# Patient Record
Sex: Female | Born: 1985 | Race: White | Hispanic: No | Marital: Single | Smoking: Current every day smoker
Health system: Southern US, Community
[De-identification: ages and names within clinical notes are randomized; demographics above are authoritative.]

## PROBLEM LIST (undated history)

## (undated) DIAGNOSIS — F1120 Opioid dependence, uncomplicated: Secondary | ICD-10-CM

## (undated) DIAGNOSIS — Z9109 Other allergy status, other than to drugs and biological substances: Secondary | ICD-10-CM

## (undated) HISTORY — PX: TUBAL LIGATION: SHX77

## (undated) HISTORY — DX: Opioid dependence, uncomplicated: F11.20

## (undated) HISTORY — DX: Other allergy status, other than to drugs and biological substances: Z91.09

---
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
## 2004-05-09 ENCOUNTER — Emergency Department: Payer: Self-pay | Admitting: Emergency Medicine

## 2004-12-23 ENCOUNTER — Observation Stay: Payer: Self-pay | Admitting: Unknown Physician Specialty

## 2005-01-14 ENCOUNTER — Observation Stay: Payer: Self-pay

## 2005-01-15 ENCOUNTER — Observation Stay: Payer: Self-pay

## 2005-01-16 ENCOUNTER — Inpatient Hospital Stay: Payer: Self-pay | Admitting: Unknown Physician Specialty

## 2007-05-07 ENCOUNTER — Emergency Department: Payer: Self-pay | Admitting: Emergency Medicine

## 2007-05-28 ENCOUNTER — Emergency Department: Payer: Self-pay | Admitting: Emergency Medicine

## 2007-06-18 ENCOUNTER — Emergency Department: Payer: Self-pay | Admitting: Emergency Medicine

## 2008-07-16 ENCOUNTER — Emergency Department: Payer: Self-pay | Admitting: Emergency Medicine

## 2008-10-25 ENCOUNTER — Emergency Department: Payer: Self-pay | Admitting: Emergency Medicine

## 2009-01-03 ENCOUNTER — Emergency Department: Payer: Self-pay | Admitting: Emergency Medicine

## 2009-01-24 ENCOUNTER — Emergency Department: Payer: Self-pay | Admitting: Emergency Medicine

## 2009-05-21 ENCOUNTER — Emergency Department: Payer: Self-pay | Admitting: Emergency Medicine

## 2010-12-11 ENCOUNTER — Emergency Department: Payer: Self-pay | Admitting: Internal Medicine

## 2011-07-21 ENCOUNTER — Emergency Department: Payer: Self-pay | Admitting: Emergency Medicine

## 2011-07-21 LAB — COMPREHENSIVE METABOLIC PANEL
Albumin: 3.4 g/dL (ref 3.4–5.0)
Alkaline Phosphatase: 112 U/L (ref 50–136)
BUN: 12 mg/dL (ref 7–18)
Bilirubin,Total: 1.2 mg/dL — ABNORMAL HIGH (ref 0.2–1.0)
Co2: 25 mmol/L (ref 21–32)
Creatinine: 0.83 mg/dL (ref 0.60–1.30)
EGFR (Non-African Amer.): >60
Glucose: 96 mg/dL (ref 65–99)
Osmolality: 279 (ref 275–301)
Potassium: 3.6 mmol/L (ref 3.5–5.1)
SGPT (ALT): 32 U/L
Sodium: 140 mmol/L (ref 136–145)

## 2011-07-21 LAB — URINALYSIS, COMPLETE
Bilirubin,UR: NEGATIVE
Blood: NEGATIVE
Glucose,UR: NEGATIVE mg/dL (ref 0–75)
Ketone: NEGATIVE
Leukocyte Esterase: NEGATIVE
Nitrite: NEGATIVE
Ph: 5 (ref 4.5–8.0)
RBC,UR: 1 /HPF (ref 0–5)
Specific Gravity: 1.021 (ref 1.003–1.030)
Squamous Epithelial: 5

## 2011-07-21 LAB — CBC
HCT: 38.5 % (ref 35.0–47.0)
MCH: 28.5 pg (ref 26.0–34.0)
MCHC: 33.9 g/dL (ref 32.0–36.0)
RDW: 12.9 % (ref 11.5–14.5)
WBC: 8.6 10*3/uL (ref 3.6–11.0)

## 2011-07-21 LAB — RAPID INFLUENZA A&B ANTIGENS

## 2011-07-21 LAB — PREGNANCY, URINE: Pregnancy Test, Urine: NEGATIVE m[IU]/mL

## 2011-11-07 ENCOUNTER — Emergency Department: Payer: Self-pay | Admitting: Internal Medicine

## 2011-11-07 LAB — URINALYSIS, COMPLETE
Bacteria: NONE SEEN
Blood: NEGATIVE
Glucose,UR: NEGATIVE mg/dL (ref 0–75)
Ketone: NEGATIVE
Leukocyte Esterase: NEGATIVE
Nitrite: NEGATIVE
RBC,UR: NONE SEEN /HPF (ref 0–5)
Specific Gravity: 1.019 (ref 1.003–1.030)
Squamous Epithelial: 4
WBC UR: <1 /HPF (ref 0–5)

## 2011-11-07 LAB — HCG, QUANTITATIVE, PREGNANCY: Beta Hcg, Quant.: 90800 m[IU]/mL — ABNORMAL HIGH

## 2011-11-07 LAB — PREGNANCY, URINE: Pregnancy Test, Urine: POSITIVE m[IU]/mL

## 2012-04-20 ENCOUNTER — Observation Stay: Payer: Self-pay

## 2012-04-20 LAB — URINALYSIS, COMPLETE
Bilirubin,UR: NEGATIVE
Glucose,UR: 150 mg/dL (ref 0–75)
Leukocyte Esterase: NEGATIVE
Nitrite: POSITIVE
Ph: 5 (ref 4.5–8.0)
Protein: NEGATIVE
RBC,UR: <1 /HPF (ref 0–5)
Specific Gravity: 1.026 (ref 1.003–1.030)
Squamous Epithelial: 5
WBC UR: 3 /HPF (ref 0–5)

## 2012-06-26 ENCOUNTER — Inpatient Hospital Stay: Payer: Self-pay

## 2012-06-27 LAB — CBC WITH DIFFERENTIAL/PLATELET
Basophil #: 0.1 10*3/uL (ref 0.0–0.1)
Basophil %: 0.8 %
HCT: 36.2 % (ref 35.0–47.0)
HGB: 12.5 g/dL (ref 12.0–16.0)
Lymphocyte %: 26.9 %
MCH: 29.2 pg (ref 26.0–34.0)
MCHC: 34.6 g/dL (ref 32.0–36.0)
Monocyte #: 1 x10 3/mm — ABNORMAL HIGH (ref 0.2–0.9)
Neutrophil #: 8.2 10*3/uL — ABNORMAL HIGH (ref 1.4–6.5)
Neutrophil %: 63.8 %
Platelet: 159 10*3/uL (ref 150–440)
RBC: 4.28 10*6/uL (ref 3.80–5.20)

## 2012-08-27 ENCOUNTER — Ambulatory Visit: Payer: Self-pay

## 2012-08-27 LAB — HEMATOCRIT: HCT: 41.3 % (ref 35.0–47.0)

## 2012-08-27 LAB — HCG, QUANTITATIVE, PREGNANCY: Beta Hcg, Quant.: <1 m[IU]/mL — ABNORMAL LOW

## 2012-09-03 ENCOUNTER — Ambulatory Visit: Payer: Self-pay

## 2012-10-03 ENCOUNTER — Emergency Department: Payer: Self-pay | Admitting: Emergency Medicine

## 2012-10-03 LAB — URINALYSIS, COMPLETE
Bacteria: NONE SEEN
Bilirubin,UR: NEGATIVE
Blood: NEGATIVE
Ketone: NEGATIVE
Leukocyte Esterase: NEGATIVE
Nitrite: NEGATIVE
Ph: 6 (ref 4.5–8.0)
Specific Gravity: 1.05 (ref 1.003–1.030)
WBC UR: 1 /HPF (ref 0–5)

## 2012-10-03 LAB — CBC WITH DIFFERENTIAL/PLATELET
Basophil #: 0.3 10*3/uL — ABNORMAL HIGH (ref 0.0–0.1)
HCT: 40.3 % (ref 35.0–47.0)
Lymphocyte #: 3.4 10*3/uL (ref 1.0–3.6)
MCH: 28.2 pg (ref 26.0–34.0)
MCHC: 33.7 g/dL (ref 32.0–36.0)
MCV: 84 fL (ref 80–100)
Monocyte %: 5.6 %
Neutrophil #: 10.6 10*3/uL — ABNORMAL HIGH (ref 1.4–6.5)

## 2012-10-03 LAB — COMPREHENSIVE METABOLIC PANEL
Albumin: 3.6 g/dL (ref 3.4–5.0)
Anion Gap: 5 — ABNORMAL LOW (ref 7–16)
Bilirubin,Total: 0.5 mg/dL (ref 0.2–1.0)
Calcium, Total: 8.7 mg/dL (ref 8.5–10.1)
Chloride: 104 mmol/L (ref 98–107)
Creatinine: 0.75 mg/dL (ref 0.60–1.30)
EGFR (African American): >60
Glucose: 109 mg/dL — ABNORMAL HIGH (ref 65–99)
Potassium: 3.7 mmol/L (ref 3.5–5.1)
SGPT (ALT): 37 U/L (ref 12–78)
Sodium: 137 mmol/L (ref 136–145)
Total Protein: 7.8 g/dL (ref 6.4–8.2)

## 2012-10-18 LAB — CULTURE, BLOOD (SINGLE)

## 2012-12-19 IMAGING — CT CT ABD-PELV W/O CM
1 of 2 series · 15 of 32 positions shown, 19 images · non-contrast
Comparison: none

REASON FOR EXAM: (1) right flank pain RLQ pain  hx of stones; (2) RLQ
right flank pain hx of ston
COMMENTS:

PROCEDURE:     CT  - CT ABDOMEN AND PELVIS W[DATE]  [DATE]
RESULT:     Comparison: None
TECHNIQUE: Multiple axial images from the lung bases to the symphysis pubis
were obtained without oral and without intravenous contrast.

[Series 2: 3mm soft tissue · axial · 0.72mm/px · z∈[-959,-497]mm · 15 of 169 slices shown, 19 images]
[im 8/169  soft-tissue]
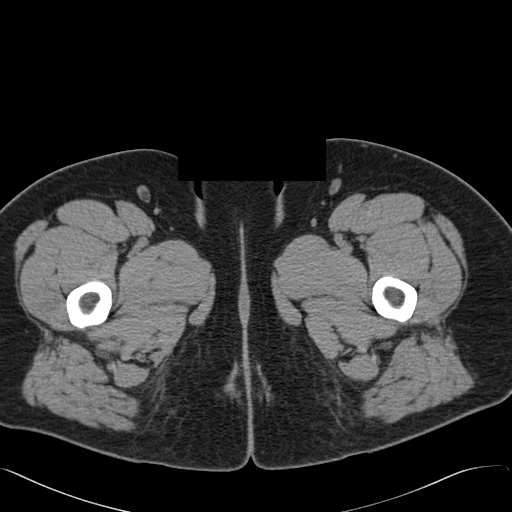
[im 8/169  bone]
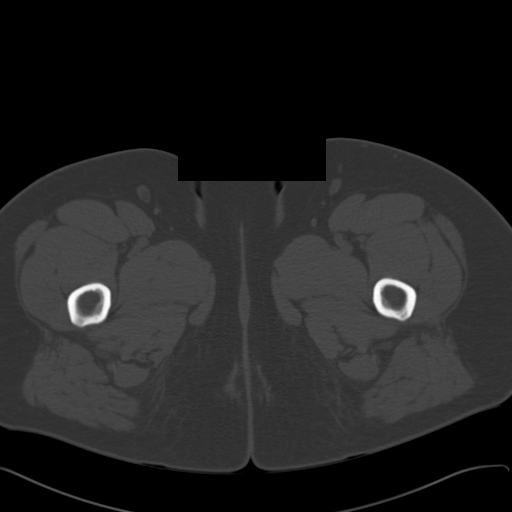
[im 22/169  soft-tissue]
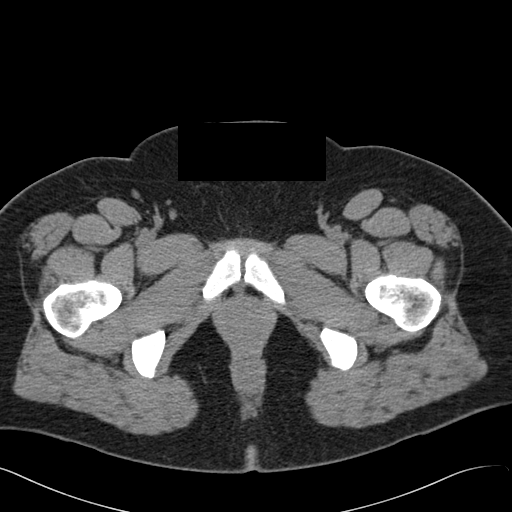
[im 36/169  soft-tissue]
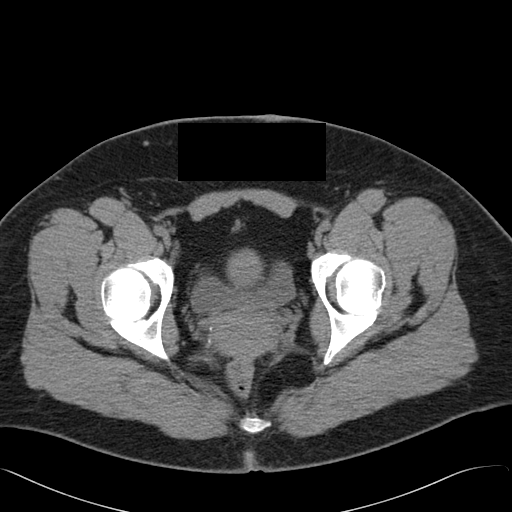
[im 50/169  soft-tissue]
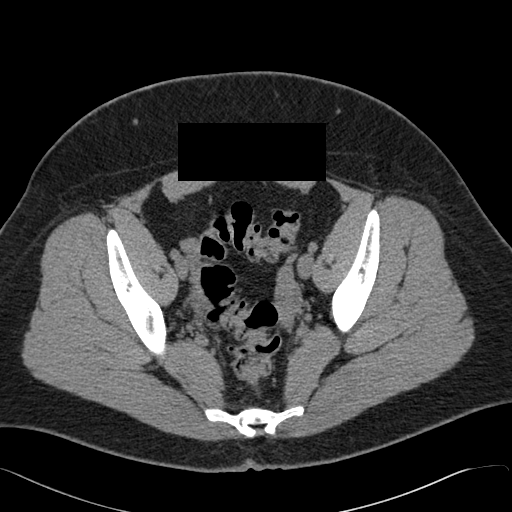
[im 57/169  soft-tissue]
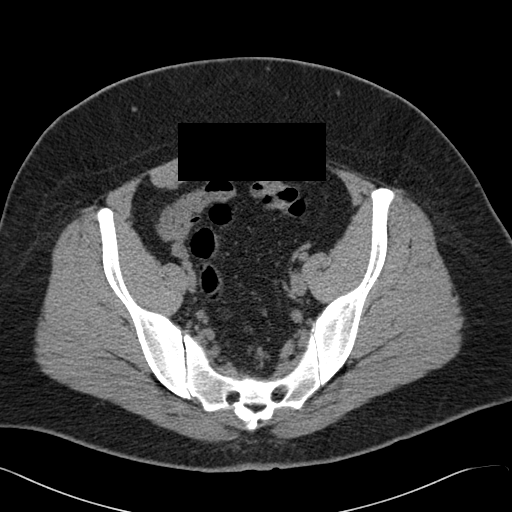
[im 71/169  soft-tissue]
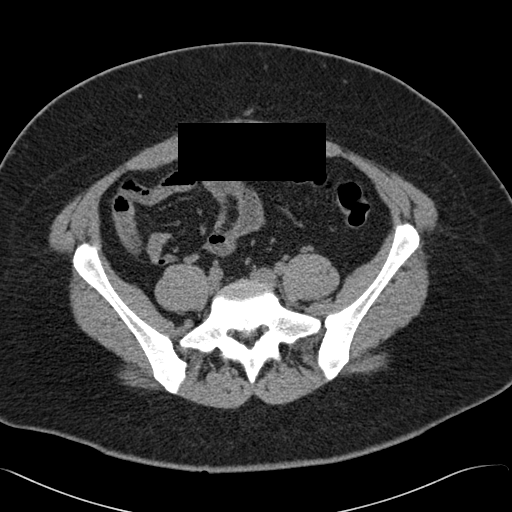
[im 85/169  soft-tissue]
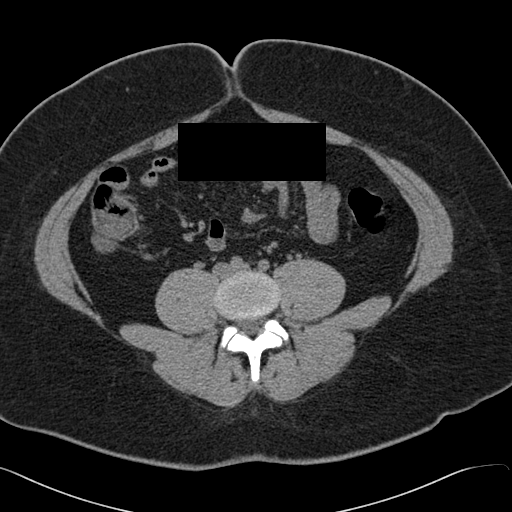
[im 99/169  soft-tissue]
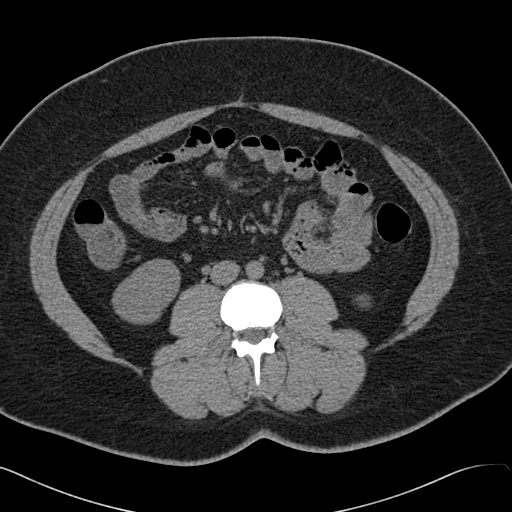
[im 113/169  soft-tissue]
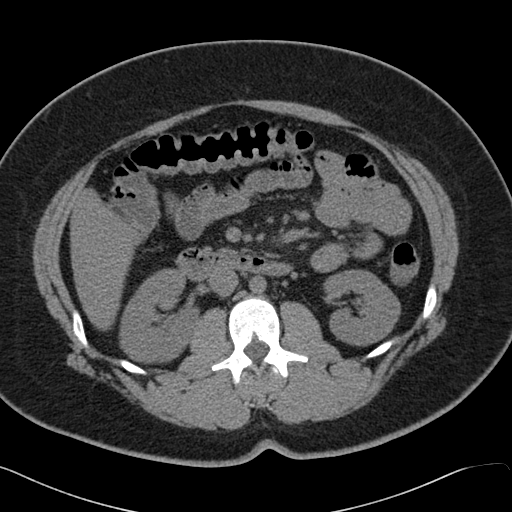
[im 113/169  bone]
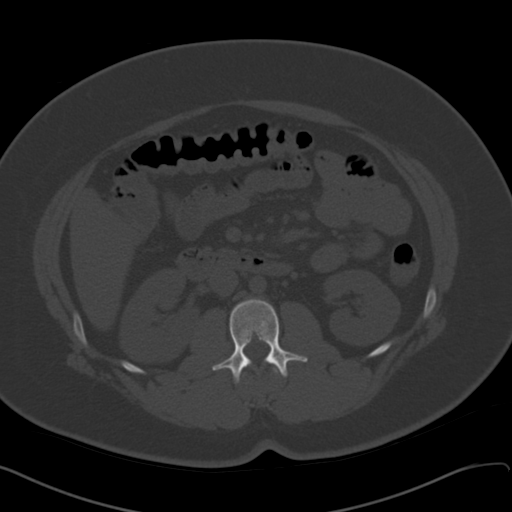
[im 120/169  soft-tissue]
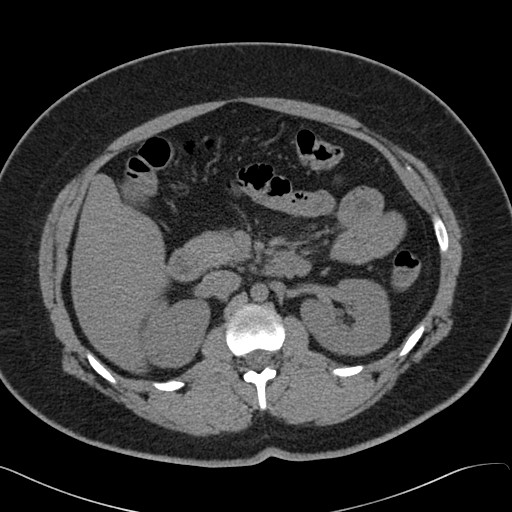
[im 134/169  soft-tissue]
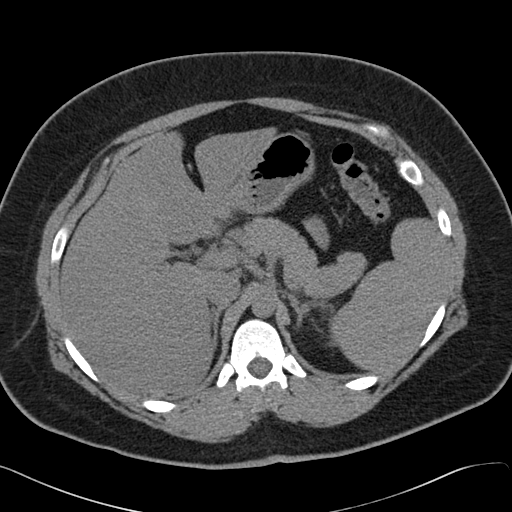
[im 141/169  lung]
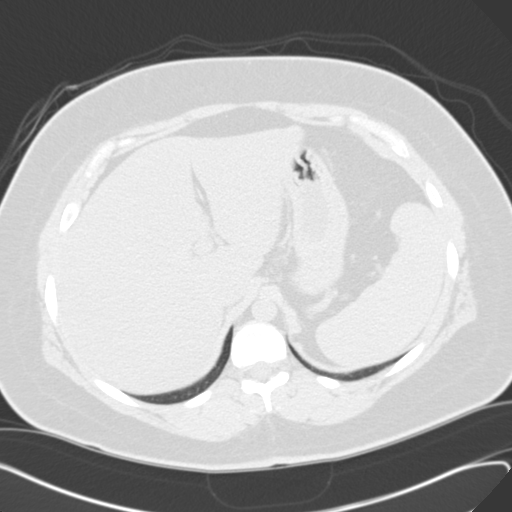
[im 148/169  soft-tissue]
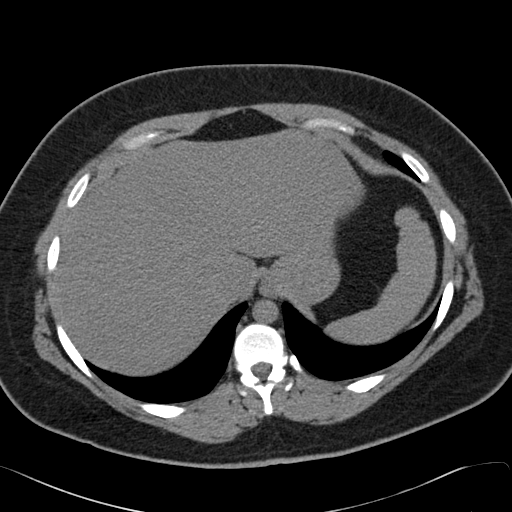
[im 148/169  lung]
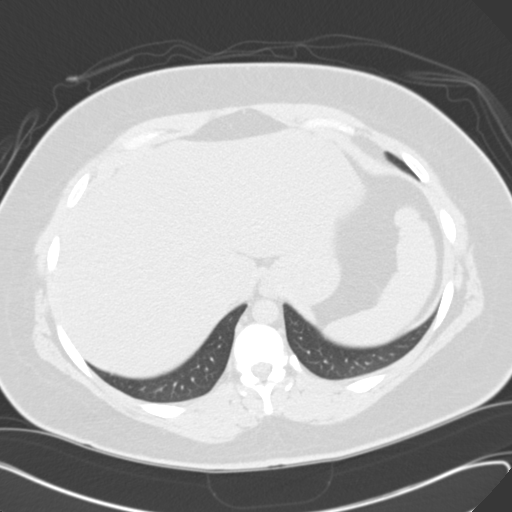
[im 155/169  lung]
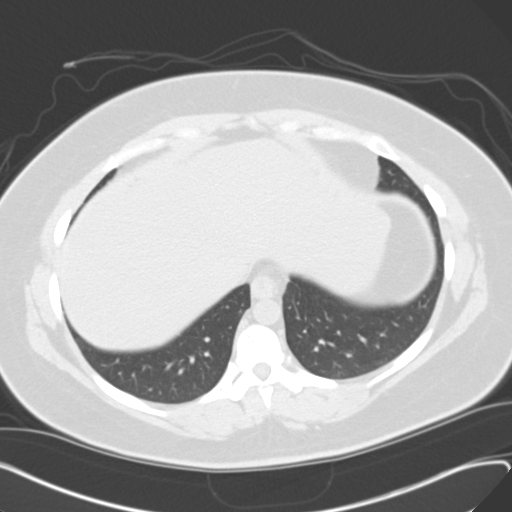
[im 162/169  soft-tissue]
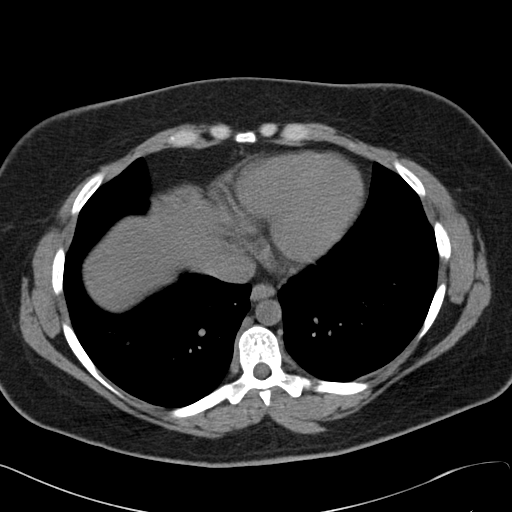
[im 162/169  lung]
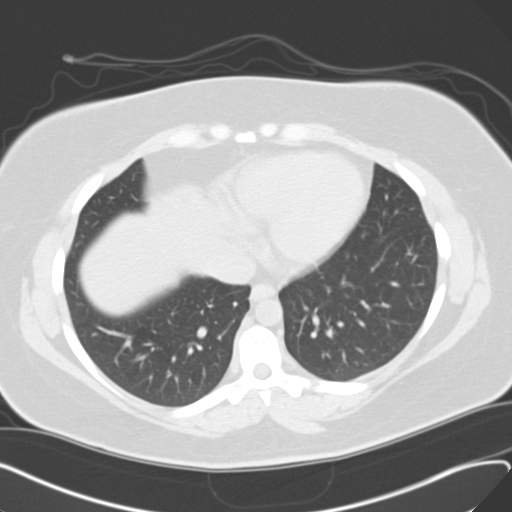

[15 of 32 positions shown; findings below may reference images not displayed]

FINDINGS: Lack of intravenous contrast limits evaluation of the solid abdominal
organs.  The liver is somewhat low in attenuation, raising the possibility
of hepatic steatosis. Tiny low-attenuation lesion in the left hepatic lobe
is too small to characterize. The gallbladder, spleen, adrenals, and
pancreas are unremarkable.

No renal calculi or hydronephrosis. No ureterectasis. There is a 2 mm
calcification in the right hemipelvis, which is unclear if it is within the
right ureter or immediately adjacent to the right ureter.

The small and large bowel are normal in caliber. The appendix is normal.

No aggressive lytic or sclerotic osseous lesions are identified.
IMPRESSION: Possible 2 mm calculus in the distal right ureter. No hydronephrosis or
ureterectasis. It is possible this calcification is a phlebolith immediately
adjacent to the distal right ureter.

## 2013-01-05 ENCOUNTER — Emergency Department: Payer: Self-pay | Admitting: Emergency Medicine

## 2013-05-26 ENCOUNTER — Emergency Department: Payer: Self-pay | Admitting: Internal Medicine

## 2013-06-30 ENCOUNTER — Emergency Department: Payer: Self-pay | Admitting: Emergency Medicine

## 2013-09-08 ENCOUNTER — Emergency Department: Payer: Self-pay | Admitting: Emergency Medicine

## 2013-09-13 ENCOUNTER — Emergency Department: Payer: Self-pay | Admitting: Emergency Medicine

## 2013-10-02 ENCOUNTER — Emergency Department: Payer: Self-pay | Admitting: Emergency Medicine

## 2013-11-02 ENCOUNTER — Emergency Department (HOSPITAL_COMMUNITY)
Admission: EM | Admit: 2013-11-02 | Discharge: 2013-11-02 | Discharge status: Against Medical Advice | Payer: Medicaid Other | Attending: Emergency Medicine | Admitting: Emergency Medicine

## 2013-11-02 ENCOUNTER — Encounter (HOSPITAL_COMMUNITY): Payer: Self-pay | Admitting: Emergency Medicine

## 2013-11-02 DIAGNOSIS — G8911 Acute pain due to trauma: Secondary | ICD-10-CM | POA: Insufficient documentation

## 2013-11-02 DIAGNOSIS — F17200 Nicotine dependence, unspecified, uncomplicated: Secondary | ICD-10-CM | POA: Insufficient documentation

## 2013-11-02 DIAGNOSIS — M25519 Pain in unspecified shoulder: Secondary | ICD-10-CM | POA: Insufficient documentation

## 2013-11-02 DIAGNOSIS — Z87828 Personal history of other (healed) physical injury and trauma: Secondary | ICD-10-CM | POA: Insufficient documentation

## 2013-11-02 DIAGNOSIS — M25512 Pain in left shoulder: Secondary | ICD-10-CM

## 2013-11-02 MED ORDER — HYDROCODONE-ACETAMINOPHEN 5-325 MG PO TABS: 2.0000 | ORAL_TABLET | Freq: Once | ORAL | Status: DC

## 2013-11-02 MED ORDER — KETOROLAC TROMETHAMINE 60 MG/2ML IM SOLN
60.0000 mg | Freq: Once | INTRAMUSCULAR | Status: DC
  Filled 2013-11-02: qty 2
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
## 2013-11-02 NOTE — ED Notes (Signed)
Patient stated she didn't want tylenol, ibuprofen or aleve because she has it at home.   She didn't want toradol, because she doesn't like shots.   Patient had two young children with her and the baby was nonstop crying.   RNs tried to console the child.   Patient's husband was to come back in "30 minutes" to get the children.   Patient was advised that the children could not go to xray with patient and that we would wait until husband got back and would take her then as to not further upset the children.   Patient was upset and crying, stated "my husband's 30 minutes is sometimes 2 hours".   I will just go.   Patient was calmed down and she agreed to wait, then left with the children when I stepped out of the room. PA made aware.
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
## 2013-11-04 NOTE — ED Provider Notes (Signed)
Medical screening examination/treatment/procedure(s) were performed by non-physician practitioner and as supervising physician I was immediately available for consultation/collaboration.  Shaindel Sweeten Y. Riannon Mukherjee, MD 11/04/13 0728 

## 2014-03-04 IMAGING — CT CT ABD-PELV W/ CM
1 of 2 series · 15 of 32 positions shown, 19 images · non-contrast
Comparison: none

REASON FOR EXAM: (1) abd red swollen tender pus coming from abd; (2) abd
pain swelling redness.
COMMENTS:

PROCEDURE:     CT  - CT ABDOMEN / PELVIS  W  - October 03, 2012  [DATE]
RESULT:     Comparison is made to a prior study dated 07/21/2011.
TECHNIQUE: Helical 3 mm sections were obtained from the lung bases through
the pubic symphysis status post intravenous administration of 100 mL of
Fsovue-DOO.

[Series 2: 3mm soft tissue · axial · 0.88mm/px · z∈[-498,-60]mm · 15 of 160 slices shown, 19 images]
[im 7/160  soft-tissue]
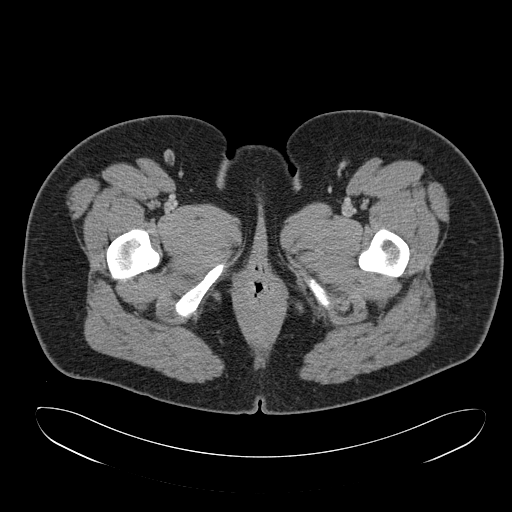
[im 7/160  bone]
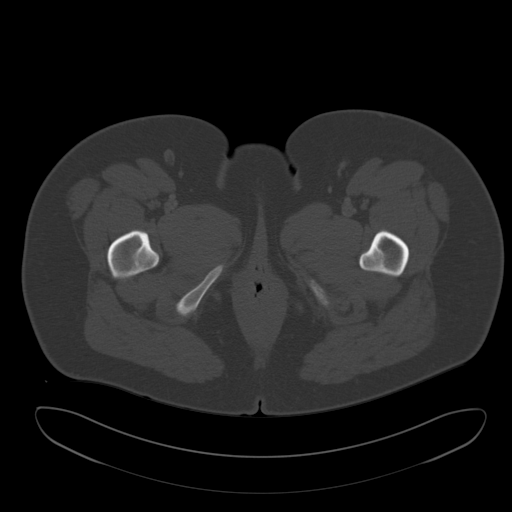
[im 20/160  soft-tissue]
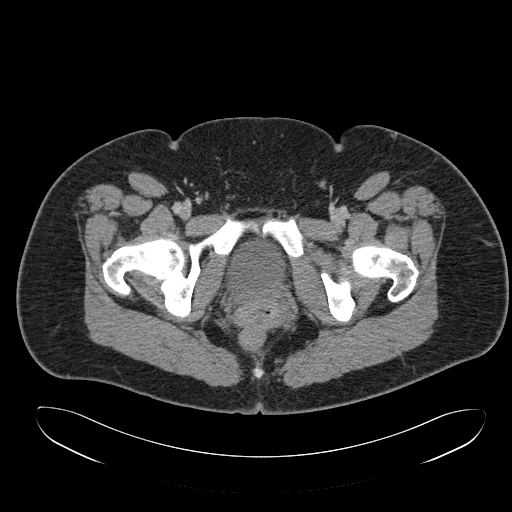
[im 34/160  soft-tissue]
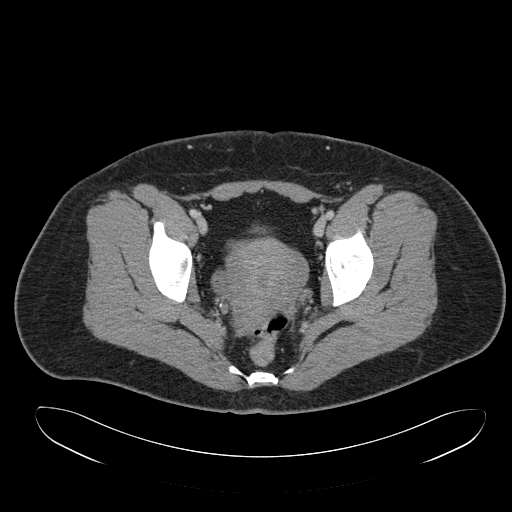
[im 47/160  soft-tissue]
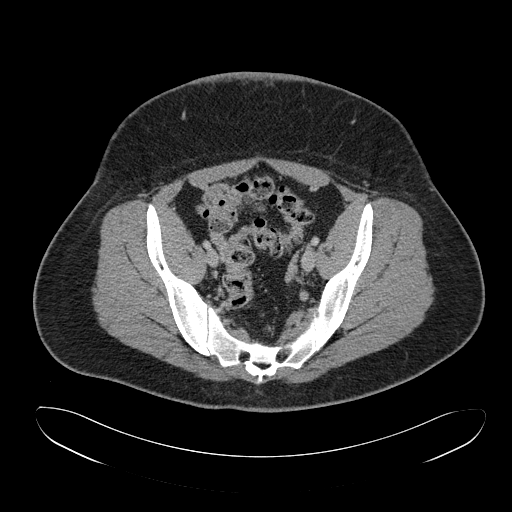
[im 54/160  soft-tissue]
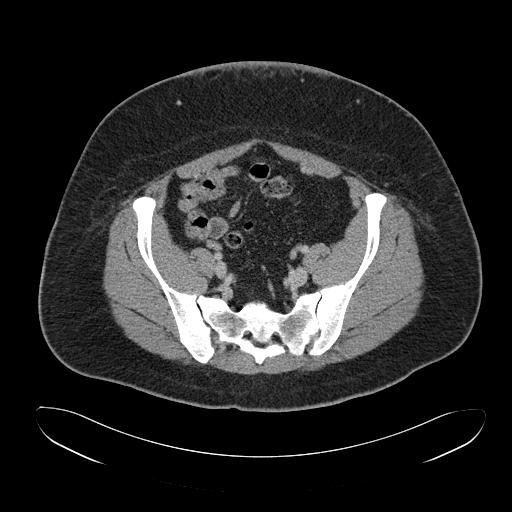
[im 67/160  soft-tissue]
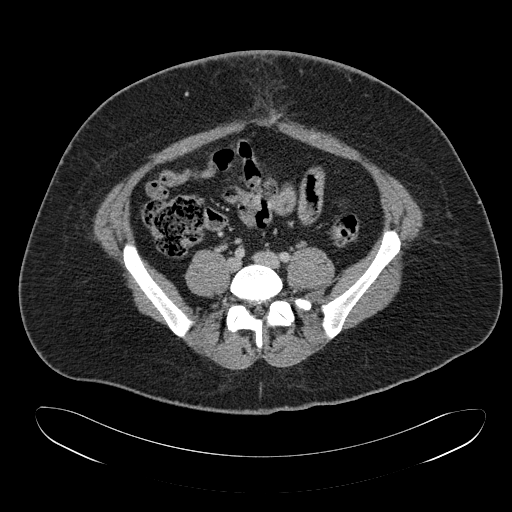
[im 80/160  soft-tissue]
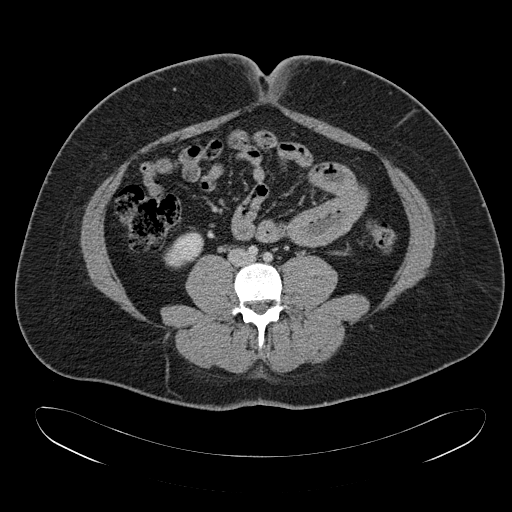
[im 93/160  soft-tissue]
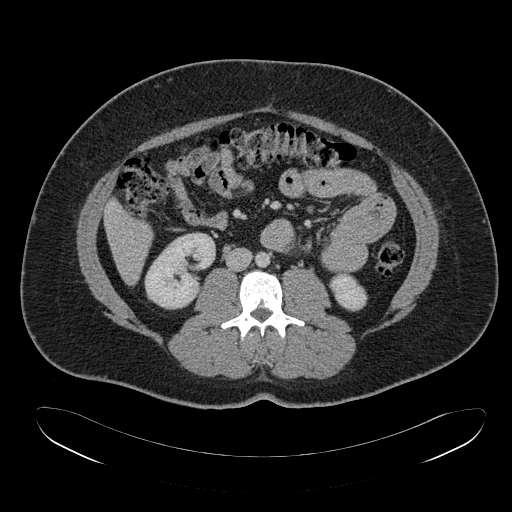
[im 107/160  soft-tissue]
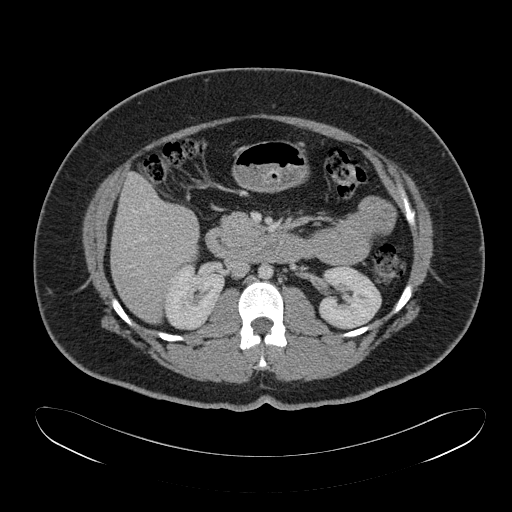
[im 107/160  bone]
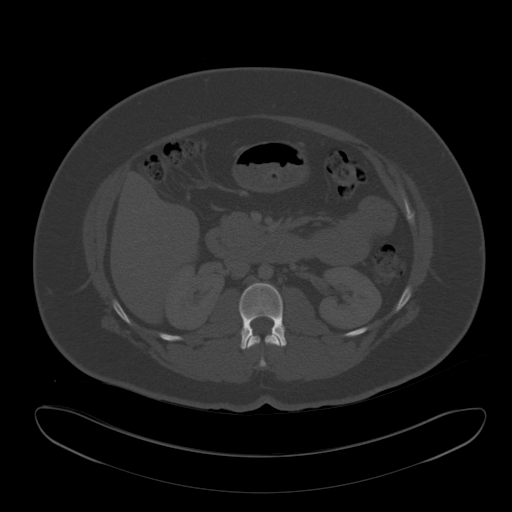
[im 113/160  soft-tissue]
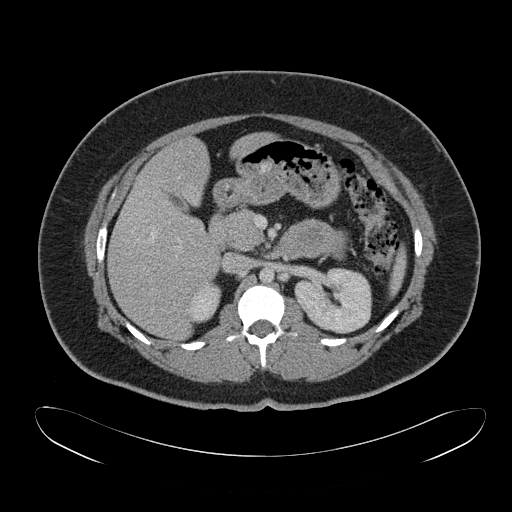
[im 126/160  soft-tissue]
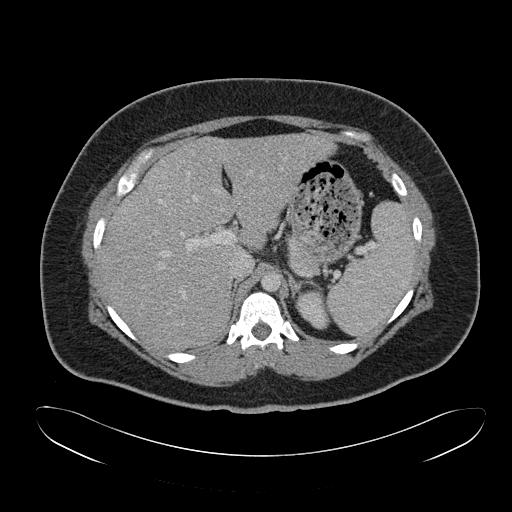
[im 133/160  lung]
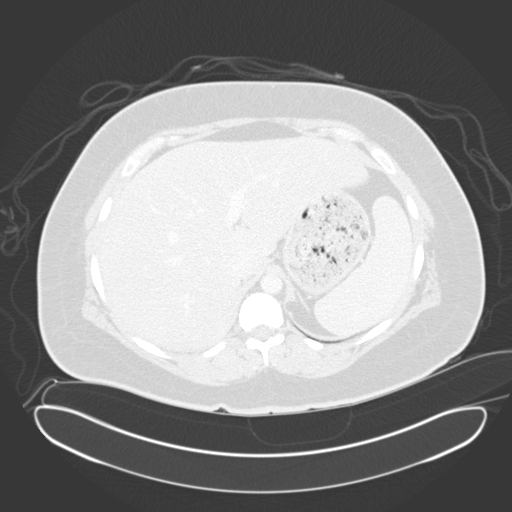
[im 140/160  soft-tissue]
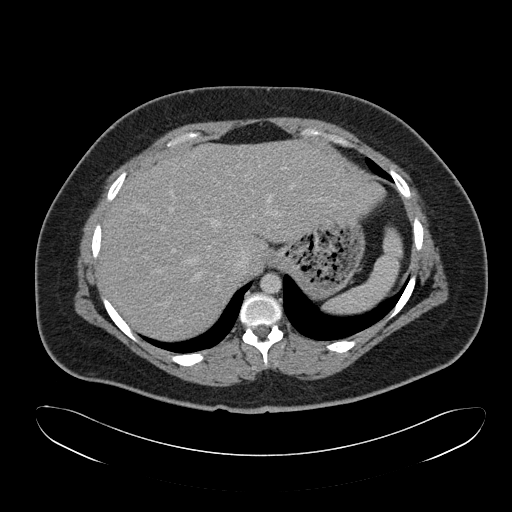
[im 140/160  lung]
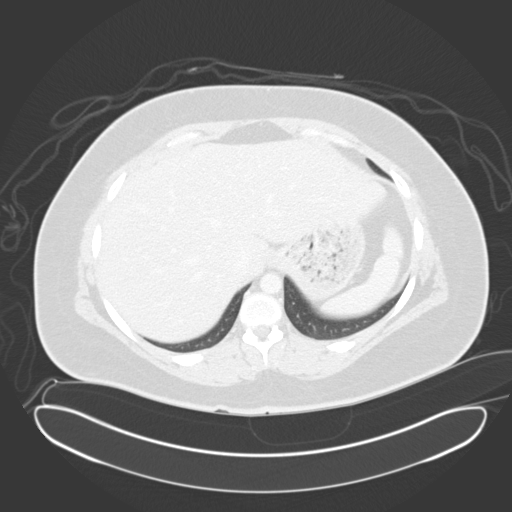
[im 146/160  lung]
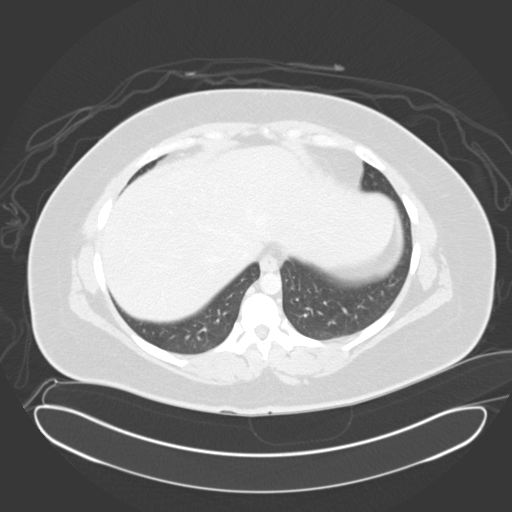
[im 153/160  soft-tissue]
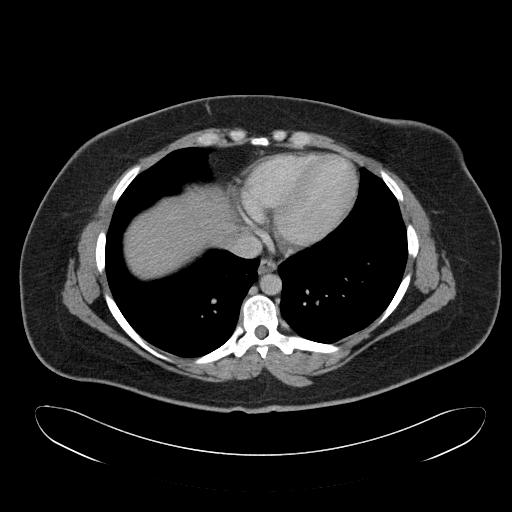
[im 153/160  lung]
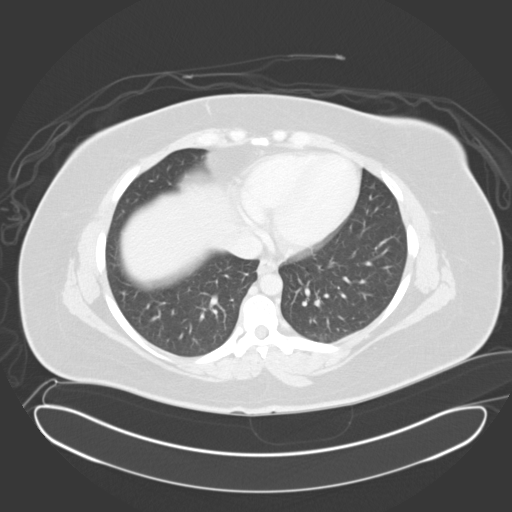

[15 of 32 positions shown; findings below may reference images not displayed]

FINDINGS: The lung bases are unremarkable.

The liver, spleen, adrenals, pancreas, and kidneys are unremarkable. A
moderate amount of fecal retention is identified within the colon. There is
no evidence of abdominal or pelvic free fluid, loculated fluid collections,
masses, or adenopathy. There is no evidence of an abdominal aortic aneurysm
nor bowel obstruction. A very small fat containing umbilical hernia is
identified. There is no evidence of an abdominal aortic aneurysm. There is
stranding involving the superficial fat in the region of the umbilicus.
There is no evidence of abdominal or pelvic masses or adenopathy.
IMPRESSION: 1. Findings which may represent cellulitis in the region of the umbilicus;
otherwise no evidence of obstructive or inflammatory abnormalities.
2. Dr. Majewski of the Emergency Department was informed of these findings
via a preliminary faxed report.

## 2014-10-28 NOTE — Op Note (Signed)
PATIENT NAME:  Brittney Bernard, Brittney Bernard MR#:  [id number] DATE OF BIRTH:  03/31/1986  DATE OF PROCEDURE:  09/03/2012  PREOPERATIVE DIAGNOSIS: Desires sterilization.   POSTOPERATIVE DIAGNOSIS:  Desires sterilization.    POSTOPERATIVE DIAGNOSIS: Bilateral tubal with bilateral cautery.   SURGEON: Deloris Pinghilip J. Luella Cookosenow, M.D.   ANESTHESIA: General.   OPERATIVE FINDINGS: Normal appearing tubes.   OPERATION: After adequate general anesthesia, the patient was prepped and draped in routine fashion. An infraumbilical incision was made through the skin and approximately 3 liters of carbon dioxide were insufflated without incident. During insufflation a uterine manipulator was placed and the bladder was drained. The laparoscope was inserted. The above findings were noted. The right tube was grasped and after adequate identification of the right fimbria, the right tube was coagulated by polar forceps. Coagulation continued until current meter read zero. Several of the adjacent portions were likewise cauterized. A like procedure was carried out on the other side. CO2 was allowed to escape. The skin was closed in routine fashion. The patient tolerated the procedure well and left the operating room in good condition. Sponge and needle counts were said to be correct at the end of the procedure.    ____________________________ Deloris PingPhilip J. Luella Cookosenow, MD pjr:cc D: 09/03/2012 15:48:12 ET T: 09/03/2012 18:29:57 ET JOB#: [id number]  cc: Deloris PingPhilip J. Luella Cookosenow, MD, <Dictator> Towana BadgerPHILIP J Markell Sciascia MD ELECTRONICALLY SIGNED 09/10/2012 7:29

## 2014-10-29 NOTE — Op Note (Signed)
PATIENT NAME:  Brittney Bernard, Brittney Bernard MR#:  [id number] DATE OF BIRTH:  December 06, 1985  DATE OF PROCEDURE:  09/08/2013  PREOPERATIVE DIAGNOSIS: Anterior fracture-dislocation, left shoulder.   POSTOPERATIVE DIAGNOSIS:  Anterior fracture-dislocation, left shoulder.   OPERATION: Closed reduction of the left shoulder fracture-dislocation.   SURGEON: Valinda HoarHoward E. Gualberto Wahlen, M.D.   ANESTHESIA: IV sedation with fentanyl and propofol.   COMPLICATIONS: None.   DRAINS: None.     ESTIMATED BLOOD LOSS:  None.  DESCRIPTION OF PROCEDURE: Dr. Loleta Roseory Forbach provided IV sedation with fentanyl and propofol. Adequate sedation was obtained and the left shoulder was reduced closed by the use of traction. There was excellent range of motion following this. It was stable. She was placed in a shoulder immobilizer. Portable x-rays in the immobilizer showed the humeral head to be well reduced and the fracture fragments of the greater tuberosity well reduced.    RECOMMENDATION: The patient will be kept in a shoulder immobilizer and given pain medicine. She will return to my office in 5 days for exam and x-ray.    ____________________________ Valinda HoarHoward E. Sherlyn Ebbert, MD hem:dmm D: 09/08/2013 17:27:43 ET T: 09/08/2013 20:57:55 ET JOB#: [id number]  cc: Valinda HoarHoward E. Floris Neuhaus, MD, <Dictator> Valinda HoarHOWARD E Jartavious Mckimmy MD ELECTRONICALLY SIGNED 09/09/2013 7:40
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
## 2019-02-27 ENCOUNTER — Other Ambulatory Visit: Payer: Self-pay

## 2019-02-27 ENCOUNTER — Emergency Department: Payer: Medicaid Other

## 2019-02-27 ENCOUNTER — Emergency Department
Admission: EM | Admit: 2019-02-27 | Discharge: 2019-02-27 | Disposition: A | Discharge status: Home / Self Care | Payer: Medicaid Other | Attending: Emergency Medicine | Admitting: Emergency Medicine

## 2019-02-27 ENCOUNTER — Encounter: Payer: Self-pay | Admitting: Emergency Medicine

## 2019-02-27 DIAGNOSIS — R609 Edema, unspecified: Secondary | ICD-10-CM | POA: Diagnosis not present

## 2019-02-27 DIAGNOSIS — Z20828 Contact with and (suspected) exposure to other viral communicable diseases: Secondary | ICD-10-CM | POA: Diagnosis not present

## 2019-02-27 DIAGNOSIS — R05 Cough: Secondary | ICD-10-CM | POA: Diagnosis not present

## 2019-02-27 DIAGNOSIS — R0602 Shortness of breath: Secondary | ICD-10-CM | POA: Diagnosis not present

## 2019-02-27 DIAGNOSIS — F17210 Nicotine dependence, cigarettes, uncomplicated: Secondary | ICD-10-CM | POA: Diagnosis not present

## 2019-02-27 DIAGNOSIS — R635 Abnormal weight gain: Secondary | ICD-10-CM | POA: Diagnosis not present

## 2019-02-27 DIAGNOSIS — R2243 Localized swelling, mass and lump, lower limb, bilateral: Secondary | ICD-10-CM | POA: Diagnosis present

## 2019-02-27 LAB — CBC WITH DIFFERENTIAL/PLATELET
Abs Immature Granulocytes: 0.02 10*3/uL (ref 0.00–0.07)
Basophils Absolute: 0.1 10*3/uL (ref 0.0–0.1)
Basophils Relative: 1 %
Eosinophils Absolute: 0.8 10*3/uL — ABNORMAL HIGH (ref 0.0–0.5)
Eosinophils Relative: 9 %
HCT: 38.3 % (ref 36.0–46.0)
Hemoglobin: 13 g/dL (ref 12.0–15.0)
Immature Granulocytes: 0 %
Lymphocytes Relative: 35 %
Lymphs Abs: 3 10*3/uL (ref 0.7–4.0)
MCH: 28.3 pg (ref 26.0–34.0)
MCHC: 33.9 g/dL (ref 30.0–36.0)
MCV: 83.3 fL (ref 80.0–100.0)
Monocytes Absolute: 0.4 10*3/uL (ref 0.1–1.0)
Monocytes Relative: 5 %
Neutro Abs: 4.3 10*3/uL (ref 1.7–7.7)
Neutrophils Relative %: 50 %
Platelets: 251 10*3/uL (ref 150–400)
RBC: 4.6 MIL/uL (ref 3.87–5.11)
RDW: 12.6 % (ref 11.5–15.5)
WBC: 8.6 10*3/uL (ref 4.0–10.5)
nRBC: 0 % (ref 0.0–0.2)

## 2019-02-27 LAB — COMPREHENSIVE METABOLIC PANEL
ALT: 31 U/L (ref 0–44)
AST: 29 U/L (ref 15–41)
Albumin: 3.9 g/dL (ref 3.5–5.0)
Alkaline Phosphatase: 102 U/L (ref 38–126)
Anion gap: 9 (ref 5–15)
BUN: 14 mg/dL (ref 6–20)
CO2: 25 mmol/L (ref 22–32)
Calcium: 9 mg/dL (ref 8.9–10.3)
Chloride: 105 mmol/L (ref 98–111)
Creatinine, Ser: 0.74 mg/dL (ref 0.44–1.00)
GFR calc Af Amer: >60 mL/min (ref >60)
GFR calc non Af Amer: >60 mL/min (ref >60)
Glucose, Bld: 98 mg/dL (ref 70–99)
Potassium: 3.7 mmol/L (ref 3.5–5.1)
Sodium: 139 mmol/L (ref 135–145)
Total Bilirubin: 0.5 mg/dL (ref 0.3–1.2)
Total Protein: 7.5 g/dL (ref 6.5–8.1)

## 2019-02-27 LAB — URINALYSIS, COMPLETE (UACMP) WITH MICROSCOPIC
Bilirubin Urine: NEGATIVE
Glucose, UA: NEGATIVE mg/dL
Hgb urine dipstick: NEGATIVE
Ketones, ur: NEGATIVE mg/dL
Leukocytes,Ua: NEGATIVE
Nitrite: NEGATIVE
Protein, ur: NEGATIVE mg/dL
Specific Gravity, Urine: 1.023 (ref 1.005–1.030)
Squamous Epithelial / HPF: NONE SEEN (ref 0–5)
pH: 6 (ref 5.0–8.0)

## 2019-02-27 LAB — POCT PREGNANCY, URINE
Preg Test, Ur: NEGATIVE
Preg Test, Ur: NEGATIVE

## 2019-02-27 LAB — BRAIN NATRIURETIC PEPTIDE: B Natriuretic Peptide: 41 pg/mL (ref 0.0–100.0)

## 2019-02-27 MED ORDER — IBUPROFEN 800 MG PO TABS
800.0000 mg | ORAL_TABLET | ORAL | Status: AC
  Administered 2019-02-27: 800 mg via ORAL
  Filled 2019-02-27: qty 1

## 2019-02-27 MED ORDER — FUROSEMIDE 40 MG PO TABS: 40.0000 mg | ORAL_TABLET | Freq: Every day | ORAL | 0 refills | Status: DC

## 2019-02-27 MED ORDER — FUROSEMIDE 40 MG PO TABS
40.0000 mg | ORAL_TABLET | Freq: Once | ORAL | Status: AC
  Administered 2019-02-27: 19:00:00 40 mg via ORAL
  Filled 2019-02-27: qty 1

## 2019-02-27 MED ORDER — FUROSEMIDE 40 MG PO TABS
20.0000 mg | ORAL_TABLET | Freq: Once | ORAL | Status: AC
  Administered 2019-02-27: 20 mg via ORAL
  Filled 2019-02-27: qty 1

## 2019-02-27 MED ORDER — POTASSIUM CHLORIDE CRYS ER 20 MEQ PO TBCR
40.0000 meq | EXTENDED_RELEASE_TABLET | Freq: Once | ORAL | Status: AC
  Administered 2019-02-27: 40 meq via ORAL
  Filled 2019-02-27: qty 2

## 2019-02-27 MED ORDER — POTASSIUM CHLORIDE ER 10 MEQ PO TBCR: 10.0000 meq | EXTENDED_RELEASE_TABLET | Freq: Every day | ORAL | 0 refills | Status: DC

## 2019-02-27 NOTE — ED Triage Notes (Signed)
Pt presents to ED c/o swelling to bil legs and feet x5 days. Pt states swelling started on L leg and spread to R and goes up into her thighs. +1 pitting edema noted. Pt states this has never happened before.

## 2019-02-27 NOTE — Discharge Instructions (Signed)
Set up an appointment Monday to see Dr. Rockey Situ for further evaluation.  Return to ER right away if you have increased shortness of breath, trouble breathing, cannot breathe, began experiencing chest pain, or other new concerns arise

## 2019-02-27 NOTE — ED Notes (Signed)
Ultrasound tech at bedside

## 2019-02-27 NOTE — ED Notes (Signed)
EKG done d/t pt's HR on dinamap varying between 70 and 170. EKG done, HR 70 on ekg

## 2019-02-27 NOTE — ED Provider Notes (Signed)
[facility] Emergency Department Provider Note   ____________________________________________   First MD Initiated Contact with Patient 02/27/19 Rickey [id number]     (approximate)  I have reviewed the triage vital signs and the nursing notes.   HISTORY  Chief Complaint Leg Swelling    HPI Brittney Bernard is a 33 y.o. female reports that about a week and a half ago she began to note she started to feel "puffy".  She is noticed increased swelling in both of her legs, seem to start low but more on the left than the right.  She is gained about 17 pounds in the last week and a half.  Over the last 3 days she is also noticed when she lays down she started to feel just slightly short of breath has to sleep up on pillows and yesterday started develop a cough with slight clear mucus.  Denies history of heart disease.  Denies pregnancy previous tubal ligation not having intercourse now.  Denies exposure to COVID.  No fevers or chills.  No recent illness that felt like a cold, no body aches no chills no diarrhea.  First is not sure why she is put all this weight and her both legs are swollen.  No history of blood clots.  She is a smoker.  Denies history of cardiac disease or congestive heart failure   History reviewed. No pertinent past medical history.  There are no active problems to display for this patient.   History reviewed. No pertinent surgical history.  Prior to Admission medications   Medication Sig Start Date End Date Taking? Authorizing Provider  furosemide (LASIX) 40 MG tablet Take 1 tablet (40 mg total) by mouth daily for 5 doses. 02/27/19 03/04/19  Sharyn CreamerQuale, Milen Lengacher, MD  potassium chloride (K-DUR) 10 MEQ tablet Take 1 tablet (10 mEq total) by mouth daily. 02/27/19   Sharyn CreamerQuale, Takeyla Million, MD    Allergies Patient has no known allergies.  History reviewed. No pertinent family history.  Social History Social History   Tobacco Use  . Smoking status: Current Every Day Smoker     Packs/day: 0.50    Types: Cigarettes  . Smokeless tobacco: Never Used  Substance Use Topics  . Alcohol use: No  . Drug use: No    Review of Systems Constitutional: No fever/chills Eyes: No visual changes. ENT: No sore throat. Cardiovascular: Denies chest pain. Respiratory: Denies shortness of breath except when she tries to lay down flat at night. Gastrointestinal: No abdominal pain.   Genitourinary: Negative for dysuria. Musculoskeletal: Negative for back pain. Skin: Negative for rash. Neurological: Negative for headaches, areas of focal weakness or numbness.    ____________________________________________   PHYSICAL EXAM:  VITAL SIGNS: ED Triage Vitals  Enc Vitals Group     BP 02/27/19 1543 137/73     Pulse Rate 02/27/19 1614 72     Resp 02/27/19 1543 18     Temp 02/27/19 1543 98.7 F (37.1 C)     Temp Source 02/27/19 1543 Oral     SpO2 02/27/19 1543 96 %     Weight 02/27/19 1543 218 lb (98.9 kg)     Height 02/27/19 1543 5\' 4"  (1.626 m)     Head Circumference --      Peak Flow --      Pain Score 02/27/19 1542 3     Pain Loc --      Pain Edu? --      Excl. in GC? --  Constitutional: Alert and oriented. Well appearing and in no acute distress.  She appears just slightly edematous throughout, more notable in lower extremities. Eyes: Conjunctivae are normal. Head: Atraumatic. Nose: No congestion/rhinnorhea. Mouth/Throat: Mucous membranes are moist. Neck: No stridor.  Cardiovascular: Normal rate, regular rhythm. Grossly normal heart sounds.  Good peripheral circulation.  Speaks in full and clear sentences without distress. Respiratory: Normal respiratory effort.  No retractions. Lungs CTAB. Gastrointestinal: Soft and nontender. No distention. Musculoskeletal: No lower extremity tenderness is about 2-3+ bilateral lower extremity pitting edema. Neurologic:  Normal speech and language. No gross focal neurologic deficits are appreciated.  Skin:  Skin is warm,  dry and intact. No rash noted. Psychiatric: Mood and affect are normal. Speech and behavior are normal.  ____________________________________________   LABS (all labs ordered are listed, but only abnormal results are displayed)  Labs Reviewed  CBC WITH DIFFERENTIAL/PLATELET - Abnormal; Notable for the following components:      Result Value   Eosinophils Absolute 0.8 (*)    All other components within normal limits  URINALYSIS, COMPLETE (UACMP) WITH MICROSCOPIC - Abnormal; Notable for the following components:   Color, Urine YELLOW (*)    APPearance CLEAR (*)    Bacteria, UA RARE (*)    All other components within normal limits  SARS CORONAVIRUS 2  COMPREHENSIVE METABOLIC PANEL  BRAIN NATRIURETIC PEPTIDE  POC URINE PREG, ED  POCT PREGNANCY, URINE  POCT PREGNANCY, URINE   ____________________________________________  EKG  I reviewed entered by me at 1545 Heart rate 70 QRS 99 QTc 420 Normal sinus rhythm no evidence of acute ischemia.  Discussed with Dr. Mariah MillingGollan ____________________________________________  RADIOLOGY  Chest x-ray personally viewed by me, no acute findings.  No pleural effusions.  No findings suggest pulmonary edema  DVT study   Dg Chest 2 View  Result Date: 02/27/2019 CLINICAL DATA:  Lower extremity edema EXAM: CHEST - 2 VIEW COMPARISON:  06/30/2013 FINDINGS: The heart size and mediastinal contours are within normal limits. Both lungs are clear. The visualized skeletal structures are unremarkable. IMPRESSION: No acute abnormality of the lungs. Electronically Signed   By: Lauralyn PrimesAlex  Bibbey M.D.   On: 02/27/2019 18:53   [place] Venous Img Lower Bilateral  Result Date: 02/27/2019 CLINICAL DATA:  Bilateral edema, exclude DVT EXAM: BILATERAL LOWER EXTREMITY VENOUS DOPPLER ULTRASOUND TECHNIQUE: Gray-scale sonography with graded compression, as well as color Doppler and duplex ultrasound were performed to evaluate the lower extremity deep venous systems from the level of  the common femoral vein and including the common femoral, femoral, profunda femoral, popliteal and calf veins including the posterior tibial, peroneal and gastrocnemius veins when visible. The superficial great saphenous vein was also interrogated. Spectral Doppler was utilized to evaluate flow at rest and with distal augmentation maneuvers in the common femoral, femoral and popliteal veins. COMPARISON:  None. FINDINGS: RIGHT LOWER EXTREMITY Common Femoral Vein: No evidence of thrombus. Normal compressibility, respiratory phasicity and response to augmentation. Saphenofemoral Junction: No evidence of thrombus. Normal compressibility and flow on color Doppler imaging. Profunda Femoral Vein: No evidence of thrombus. Normal compressibility and flow on color Doppler imaging. Femoral Vein: No evidence of thrombus. Normal compressibility, respiratory phasicity and response to augmentation. Popliteal Vein: No evidence of thrombus. Normal compressibility, respiratory phasicity and response to augmentation. Calf Veins: No evidence of thrombus. Normal compressibility and flow on color Doppler imaging. Superficial Great Saphenous Vein: No evidence of thrombus. Normal compressibility. Venous Reflux:  None. Other Findings:  None. LEFT LOWER EXTREMITY Common Femoral Vein: No evidence  of thrombus. Normal compressibility, respiratory phasicity and response to augmentation. Saphenofemoral Junction: No evidence of thrombus. Normal compressibility and flow on color Doppler imaging. Profunda Femoral Vein: No evidence of thrombus. Normal compressibility and flow on color Doppler imaging. Femoral Vein: No evidence of thrombus. Normal compressibility, respiratory phasicity and response to augmentation. Popliteal Vein: No evidence of thrombus. Normal compressibility, respiratory phasicity and response to augmentation. Calf Veins: No evidence of thrombus. Normal compressibility and flow on color Doppler imaging. Superficial Great Saphenous  Vein: No evidence of thrombus. Normal compressibility. Venous Reflux:  None. Other Findings:  None. IMPRESSION: No evidence of deep venous thrombosis in either lower extremity. Electronically Signed   By: Eddie Candle M.D.   On: 02/27/2019 20:41    ____________________________________________   PROCEDURES  Procedure(s) performed: None  Procedures  Critical Care performed: No  ____________________________________________   INITIAL IMPRESSION / ASSESSMENT AND PLAN / ED COURSE  Pertinent labs & imaging results that were available during my care of the patient were reviewed by me and considered in my medical decision making (see chart for details).   Patient presents for bilateral lower extremity edema associated with cough and some orthopnea.  Denies infectious symptoms.  Her clinical findings seem to suggest peripheral edema, clear lungs normal respirations but does report shortness of breath laying back and concerned the patient may have symptoms consistent with potentially some mild congestive heart failure weakening.  No infectious symptoms.  No chest pain.  Will obtain ultrasounds to exclude DVT, though with the bilateral nature I suspect edema he is secondary to different etiology than a blood clot.  No chest pain.  No signs or symptoms suggest PE.  No evidence of pericardial effusion on imaging or EKG.  Hemodynamically stable.  Discussed with cardiology Dr. Rockey Situ.  Will give Lasix here, obtain BNP, urinalysis to evaluate for proteinuria etc.  Clinical Course as of Feb 26 2349  Sat Feb 27, 2019  1827 DG Chest 2 View [MQ]  [id number] Case discussed with Dr. Rockey Situ. Can't get echo now but will get remaining work-up. Can be seen in clinic Monday.   [MQ]    Clinical Course User Index [MQ] Delman Kitten, MD   Barbra Sarks Wyka was evaluated in Emergency Department on 02/27/2019 for the symptoms described in the history of present illness. She was evaluated in the context of the global  COVID-19 pandemic, which necessitated consideration that the patient might be at risk for infection with the SARS-CoV-2 virus that causes COVID-19. Institutional protocols and algorithms that pertain to the evaluation of patients at risk for COVID-19 are in a state of rapid change based on information released by regulatory bodies including the CDC and federal and state organizations. These policies and algorithms were followed during the patient's care in the ED.  ----------------------------------------- 8:10 PM on 02/27/2019 -----------------------------------------  Patient resting comfortably, but has urinated some now.  Will give additional dose of Lasix here and also prescribe 40 mg daily as recommended by Dr. Rockey Situ.  Patient will schedule a follow-up appointment to see him on Monday, and Dr. Rockey Situ will also have his clinic call Monday morning to set her up for a visit that day.  Return precautions and treatment recommendations and follow-up discussed with the patient who is agreeable with the plan.   ____________________________________________   FINAL CLINICAL IMPRESSION(S) / ED DIAGNOSES  Final diagnoses:  Peripheral edema        Note:  This document was prepared using Dragon voice recognition software and may include  unintentional dictation errors       Sharyn CreamerQuale, Jimel Myler, MD 02/27/19 2350

## 2019-02-27 NOTE — ED Notes (Signed)
Pt with bilateral swelling in both legs. No redness noted

## 2019-02-28 LAB — SARS CORONAVIRUS 2 (TAT 6-24 HRS): SARS Coronavirus 2: NEGATIVE

## 2019-03-05 ENCOUNTER — Other Ambulatory Visit: Payer: Self-pay

## 2019-03-05 ENCOUNTER — Ambulatory Visit (INDEPENDENT_AMBULATORY_CARE_PROVIDER_SITE_OTHER): Payer: Medicaid Other | Admitting: Internal Medicine

## 2019-03-05 ENCOUNTER — Ambulatory Visit (INDEPENDENT_AMBULATORY_CARE_PROVIDER_SITE_OTHER): Payer: Medicaid Other

## 2019-03-05 ENCOUNTER — Encounter: Payer: Self-pay | Admitting: Internal Medicine

## 2019-03-05 ENCOUNTER — Telehealth: Payer: Self-pay | Admitting: Internal Medicine

## 2019-03-05 VITALS — BP 104/74 | HR 90 | Ht 64.0 in | Wt 247.0 lb

## 2019-03-05 DIAGNOSIS — R609 Edema, unspecified: Secondary | ICD-10-CM

## 2019-03-05 DIAGNOSIS — R55 Syncope and collapse: Secondary | ICD-10-CM | POA: Diagnosis not present

## 2019-03-05 DIAGNOSIS — R0602 Shortness of breath: Secondary | ICD-10-CM | POA: Diagnosis not present

## 2019-03-05 MED ORDER — FUROSEMIDE 40 MG PO TABS: 40.0000 mg | ORAL_TABLET | Freq: Every day | ORAL | 0 refills | Status: DC
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
## 2019-03-05 NOTE — Patient Instructions (Addendum)
Medication Instructions:  Your physician recommends that you continue on your current medications as directed. Please refer to the Current Medication list given to you today.   Please continue Furosemide 40 mg tablet qd.    If you need a refill on your cardiac medications before your next appointment, please call your pharmacy.   Lab work: Your physician recommends that you return for lab work in: TODAY (BMET, Mg, TSH).  If you have labs (blood work) drawn today and your tests are completely normal, you will receive your results only by: Marland Kitchen. MyChart Message (if you have MyChart) OR . A paper copy in the mail If you have any lab test that is abnormal or we need to change your treatment, we will call you to review the results.  Testing/Procedures: Your physician has requested that you have an echocardiogram. Echocardiography is a painless test that uses sound waves to create images of your heart. It provides your doctor with information about the size and shape of your heart and how well your heart's chambers and valves are working. This procedure takes approximately one hour. There are no restrictions for this procedure. You may get an IV, if needed, to receive an ultrasound enhancing agent through to better visualize your heart.    Your physician has recommended that you wear an 14 ZIO LIVE event monitor. Event monitors are medical devices that record the heart's electrical activity. Doctors most often us these monitors to diagnose arrhythmias. Arrhythmias are problems with the speed or rhythm of the heartbeat. The monitor is a small, portable device. You can wear one while you do your normal daily activities. This is usually used to diagnose what is causing palpitations/syncope (passing out).  A Zio Patch Event Heart monitor will be applied to your chest today.  You will wear the patch for 14 days. After 24 hours, you may shower with the heart monitor on. If you feel any SYMPTOMS, you may  press and release the button in the middle of the monitor. REMOVE ON 03/19/2019.   Follow-Up: At [facility], you and your health needs are our priority.  As part of our continuing mission to provide you with exceptional heart care, we have created designated Provider Care Teams.  These Care Teams include your primary Cardiologist (physician) and Advanced Practice Providers (APPs -  Physician Assistants and Nurse Practitioners) who all work together to provide you with the care you need, when you need it. You will need a follow up appointment in 6 weeks.   You may see DR Cristal DeerHRISTOPHER END or one of the following Advanced Practice Providers on your designated Care Team:   Nicolasa Duckinghristopher Berge, NP Eula Listenyan Dunn, PA-C . Marisue IvanJacquelyn Visser, PA-C    Echocardiogram An echocardiogram is a procedure that uses painless sound waves (ultrasound) to produce an image of the heart. Images from an echocardiogram can provide important information about:  Signs of coronary artery disease (CAD).  Aneurysm detection. An aneurysm is a weak or damaged part of an artery wall that bulges out from the normal force of blood pumping through the body.  Heart size and shape. Changes in the size or shape of the heart can be associated with certain conditions, including heart failure, aneurysm, and CAD.  Heart muscle function.  Heart valve function.  Signs of a past heart attack.  Fluid buildup around the heart.  Thickening of the heart muscle.  A tumor or infectious growth around the heart valves. Tell a health care provider about:  Any allergies you have.  All medicines you are taking, including vitamins, herbs, eye drops, creams, and over-the-counter medicines.  Any blood disorders you have.  Any surgeries you have had.  Any medical conditions you have.  Whether you are pregnant or may be pregnant. What are the risks? Generally, this is a safe procedure. However, problems may occur, including:   Allergic reaction to dye (contrast) that may be used during the procedure. What happens before the procedure? No specific preparation is needed. You may eat and drink normally. What happens during the procedure?   An IV tube may be inserted into one of your veins.  You may receive contrast through this tube. A contrast is an injection that improves the quality of the pictures from your heart.  A gel will be applied to your chest.  A wand-like tool (transducer) will be moved over your chest. The gel will help to transmit the sound waves from the transducer.  The sound waves will harmlessly bounce off of your heart to allow the heart images to be captured in real-time motion. The images will be recorded on a computer. The procedure may vary among health care providers and hospitals. What happens after the procedure?  You may return to your normal, everyday life, including diet, activities, and medicines, unless your health care provider tells you not to do that. Summary  An echocardiogram is a procedure that uses painless sound waves (ultrasound) to produce an image of the heart.  Images from an echocardiogram can provide important information about the size and shape of your heart, heart muscle function, heart valve function, and fluid buildup around your heart.  You do not need to do anything to prepare before this procedure. You may eat and drink normally.  After the echocardiogram is completed, you may return to your normal, everyday life, unless your health care provider tells you not to do that. This information is not intended to replace advice given to you by your health care provider. Make sure you discuss any questions you have with your health care provider. Document Released: 06/21/2000 Document Revised: 10/15/2018 Document Reviewed: 07/27/2016 Elsevier Patient Education  2020 [employer].

## 2019-03-05 NOTE — Telephone Encounter (Signed)
lmov to schedule echo and 6 wk fu

## 2019-03-06 LAB — BASIC METABOLIC PANEL
BUN/Creatinine Ratio: 28 — ABNORMAL HIGH (ref 9–23)
BUN: 14 mg/dL (ref 6–20)
CO2: 23 mmol/L (ref 20–29)
Calcium: 8.7 mg/dL (ref 8.7–10.2)
Chloride: 107 mmol/L — ABNORMAL HIGH (ref 96–106)
Creatinine, Ser: 0.5 mg/dL — ABNORMAL LOW (ref 0.57–1.00)
GFR calc Af Amer: 146 mL/min/{1.73_m2} (ref >59)
GFR calc non Af Amer: 127 mL/min/{1.73_m2} (ref >59)
Glucose: 96 mg/dL (ref 65–99)
Potassium: 4.4 mmol/L (ref 3.5–5.2)
Sodium: 142 mmol/L (ref 134–144)

## 2019-03-06 LAB — TSH: TSH: 0.818 u[IU]/mL (ref 0.450–4.500)

## 2019-03-06 LAB — MAGNESIUM: Magnesium: 2 mg/dL (ref 1.6–2.3)

## 2019-03-08 ENCOUNTER — Telehealth: Payer: Self-pay | Admitting: *Deleted

## 2019-03-08 MED ORDER — FUROSEMIDE 20 MG PO TABS: 20.0000 mg | ORAL_TABLET | Freq: Every day | ORAL | 1 refills | Status: AC

## 2019-03-08 NOTE — Telephone Encounter (Signed)
Results called to pt. Pt verbalized understanding. Med list updated and rx sent to pharmacy.

## 2019-03-08 NOTE — Telephone Encounter (Signed)
-----   Message from Nelva Bush, MD sent at 03/06/2019 10:09 AM EDT ----- No significant abnormality noted.  Okay to proceed with taking furosemide 20 mg daily.  Will defer standing potassium supplementation.

## 2019-03-29 ENCOUNTER — Telehealth: Payer: Self-pay | Admitting: *Deleted

## 2019-03-29 NOTE — Telephone Encounter (Signed)
-----   Message from Nelva Bush, MD sent at 03/29/2019  7:03 AM EDT ----- Please let Brittney Bernard know that her event monitor did not show any significant arrhythmia.  We will follow-up with her after completion of the echocardiogram.

## 2019-03-30 NOTE — Telephone Encounter (Signed)
Results called to pt. Pt verbalized understanding.  

## 2019-03-31 ENCOUNTER — Other Ambulatory Visit: Payer: Medicaid Other

## 2019-03-31 ENCOUNTER — Telehealth: Payer: Self-pay | Admitting: Internal Medicine

## 2019-03-31 DIAGNOSIS — R609 Edema, unspecified: Secondary | ICD-10-CM

## 2019-03-31 DIAGNOSIS — Z79899 Other long term (current) drug therapy: Secondary | ICD-10-CM

## 2019-03-31 NOTE — Telephone Encounter (Signed)
I recommend that she wear OTC compression stockings during the day.  We should have her come in at her convenience for a basic metabolic panel to ensure that her potassium and kidney function are normal.  Nelva Bush, MD [place] Pager: [phone]

## 2019-03-31 NOTE — Telephone Encounter (Signed)
Patient verbalized understanding of Dr Darnelle Bos recommendation and plan of care. She will go to the [facility] tomorrow afternoon if able for the lab work. BMP entered.

## 2019-03-31 NOTE — Telephone Encounter (Signed)
Pt c/o medication issue:  1. Name of Medication: fursoemide  2. How are you currently taking this medication (dosage and times per day)? 20 mg daily  3. Are you having a reaction (difficulty breathing--STAT)? cramps  4. What is your medication issue? Cramping in shoulders, arms and legs.The cramping is so bad it wakes pt up in the middle of the night. When pt wakes up the swelling is back in feet, calves and hands. Patient stated she use to take potassium but does not know and wondering if that would help.  Please advise

## 2019-03-31 NOTE — Telephone Encounter (Signed)
Spoke with patient. States she's been having cramps in shoulders, arms and legs almost every night.  The cramping wakes her up. 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 Says her lower extremity swelling is fine in the morning but returns as soon as she starts doing things and work in the morning.  Advised I will make Dr End aware and let her know any further recommendations.

## 2019-04-06 NOTE — Telephone Encounter (Signed)
Patient still has not had her lab work yet. Called patient and she said she's been busy at work but [facility].

## 2019-04-09 NOTE — Telephone Encounter (Signed)
Patient driving.  Will call back another time .

## 2019-04-21 NOTE — Telephone Encounter (Signed)
Closing encounter mailed letter.

## 2019-04-21 NOTE — Telephone Encounter (Signed)
No ans no vm   °

## 2019-06-08 ENCOUNTER — Other Ambulatory Visit: Payer: Self-pay

## 2019-06-08 DIAGNOSIS — Z20828 Contact with and (suspected) exposure to other viral communicable diseases: Secondary | ICD-10-CM | POA: Diagnosis not present

## 2019-06-08 DIAGNOSIS — Z20822 Contact with and (suspected) exposure to covid-19: Secondary | ICD-10-CM

## 2019-06-09 ENCOUNTER — Telehealth: Payer: Self-pay | Admitting: *Deleted

## 2019-06-09 NOTE — Telephone Encounter (Signed)
Patient called for results still pending ,also signed up for my chart .

## 2019-06-10 ENCOUNTER — Telehealth: Payer: Self-pay | Admitting: *Deleted

## 2019-06-10 LAB — NOVEL CORONAVIRUS, NAA: SARS-CoV-2, NAA: NOT DETECTED

## 2019-06-10 NOTE — Telephone Encounter (Signed)
Patient called and was given negative covid results . 

## 2020-02-24 ENCOUNTER — Emergency Department
Admission: EM | Admit: 2020-02-24 | Discharge: 2020-02-24 | Disposition: A | Discharge status: Home / Self Care | Payer: Medicaid Other | Attending: Emergency Medicine | Admitting: Emergency Medicine

## 2020-02-24 ENCOUNTER — Other Ambulatory Visit: Payer: Self-pay

## 2020-02-24 DIAGNOSIS — Z20822 Contact with and (suspected) exposure to covid-19: Secondary | ICD-10-CM | POA: Insufficient documentation

## 2020-02-24 DIAGNOSIS — F17210 Nicotine dependence, cigarettes, uncomplicated: Secondary | ICD-10-CM | POA: Diagnosis not present

## 2020-02-24 DIAGNOSIS — L509 Urticaria, unspecified: Secondary | ICD-10-CM | POA: Insufficient documentation

## 2020-02-24 DIAGNOSIS — J028 Acute pharyngitis due to other specified organisms: Secondary | ICD-10-CM

## 2020-02-24 DIAGNOSIS — J029 Acute pharyngitis, unspecified: Secondary | ICD-10-CM | POA: Insufficient documentation

## 2020-02-24 LAB — SARS CORONAVIRUS 2 BY RT PCR (HOSPITAL ORDER, PERFORMED IN ~~LOC~~ HOSPITAL LAB): SARS Coronavirus 2: NEGATIVE

## 2020-02-24 LAB — GROUP A STREP BY PCR: Group A Strep by PCR: NOT DETECTED

## 2020-02-24 MED ORDER — DIPHENHYDRAMINE HCL 50 MG/ML IJ SOLN
25.0000 mg | Freq: Once | INTRAMUSCULAR | Status: AC
  Administered 2020-02-24: 25 mg via INTRAVENOUS
  Filled 2020-02-24: qty 1

## 2020-02-24 MED ORDER — MAGIC MOUTHWASH W/LIDOCAINE: 5.0000 mL | Freq: Four times a day (QID) | ORAL | 0 refills | Status: DC

## 2020-02-24 MED ORDER — HYDROXYZINE HCL 50 MG PO TABS: 50.0000 mg | ORAL_TABLET | Freq: Three times a day (TID) | ORAL | 0 refills | Status: AC | PRN

## 2020-02-24 MED ORDER — METHYLPREDNISOLONE 4 MG PO TBPK: ORAL_TABLET | ORAL | 0 refills | Status: DC

## 2020-02-24 MED ORDER — LIDOCAINE VISCOUS HCL 2 % MT SOLN
15.0000 mL | Freq: Once | OROMUCOSAL | Status: AC
  Administered 2020-02-24: 15 mL via OROMUCOSAL
  Filled 2020-02-24: qty 15

## 2020-02-24 MED ORDER — DIPHENHYDRAMINE HCL 12.5 MG/5ML PO ELIX
12.5000 mg | ORAL_SOLUTION | Freq: Once | ORAL | Status: AC
  Administered 2020-02-24: 12.5 mg via ORAL
  Filled 2020-02-24: qty 5

## 2020-02-24 MED ORDER — FAMOTIDINE IN NACL 20-0.9 MG/50ML-% IV SOLN
20.0000 mg | Freq: Once | INTRAVENOUS | Status: AC
  Administered 2020-02-24: 20 mg via INTRAVENOUS
  Filled 2020-02-24: qty 50

## 2020-02-24 MED ORDER — METHYLPREDNISOLONE SODIUM SUCC 125 MG IJ SOLR
125.0000 mg | Freq: Once | INTRAMUSCULAR | Status: AC
  Administered 2020-02-24: 125 mg via INTRAMUSCULAR
  Filled 2020-02-24: qty 2

## 2020-02-24 NOTE — ED Triage Notes (Signed)
Pt comes via POV from home with c/o rash and allergic reaction to something. Pt states she noticed rash over chest last night.  Pt states redness to arms and swelling to throat.  Pt able to talk in complete sentences. Pt denies any SOB

## 2020-02-24 NOTE — Discharge Instructions (Signed)
Follow discharge care instruction take medication as directed.  Advised to consider getting the COVID-19 vaccine.
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
## 2020-03-09 DIAGNOSIS — R21 Rash and other nonspecific skin eruption: Secondary | ICD-10-CM | POA: Diagnosis not present

## 2020-03-09 DIAGNOSIS — J029 Acute pharyngitis, unspecified: Secondary | ICD-10-CM | POA: Diagnosis not present

## 2020-03-21 ENCOUNTER — Other Ambulatory Visit: Payer: Medicaid Other

## 2020-03-21 DIAGNOSIS — J351 Hypertrophy of tonsils: Secondary | ICD-10-CM | POA: Diagnosis not present

## 2020-03-31 ENCOUNTER — Other Ambulatory Visit: Payer: Medicaid Other

## 2020-04-17 DIAGNOSIS — F4312 Post-traumatic stress disorder, chronic: Secondary | ICD-10-CM | POA: Diagnosis not present

## 2020-04-17 DIAGNOSIS — F411 Generalized anxiety disorder: Secondary | ICD-10-CM | POA: Diagnosis not present

## 2020-04-17 DIAGNOSIS — F902 Attention-deficit hyperactivity disorder, combined type: Secondary | ICD-10-CM | POA: Diagnosis not present

## 2020-04-17 DIAGNOSIS — Z79899 Other long term (current) drug therapy: Secondary | ICD-10-CM | POA: Diagnosis not present

## 2020-04-17 DIAGNOSIS — Z1389 Encounter for screening for other disorder: Secondary | ICD-10-CM | POA: Diagnosis not present

## 2020-05-04 DIAGNOSIS — F411 Generalized anxiety disorder: Secondary | ICD-10-CM | POA: Diagnosis not present

## 2020-05-04 DIAGNOSIS — Z79899 Other long term (current) drug therapy: Secondary | ICD-10-CM | POA: Diagnosis not present

## 2020-05-04 DIAGNOSIS — F4312 Post-traumatic stress disorder, chronic: Secondary | ICD-10-CM | POA: Diagnosis not present

## 2020-05-04 DIAGNOSIS — F902 Attention-deficit hyperactivity disorder, combined type: Secondary | ICD-10-CM | POA: Diagnosis not present

## 2020-05-25 DIAGNOSIS — Z20822 Contact with and (suspected) exposure to covid-19: Secondary | ICD-10-CM | POA: Diagnosis not present

## 2020-06-05 DIAGNOSIS — F902 Attention-deficit hyperactivity disorder, combined type: Secondary | ICD-10-CM | POA: Diagnosis not present

## 2020-06-05 DIAGNOSIS — F4312 Post-traumatic stress disorder, chronic: Secondary | ICD-10-CM | POA: Diagnosis not present

## 2020-06-05 DIAGNOSIS — F411 Generalized anxiety disorder: Secondary | ICD-10-CM | POA: Diagnosis not present

## 2020-07-08 ENCOUNTER — Other Ambulatory Visit: Payer: Self-pay

## 2020-07-08 ENCOUNTER — Emergency Department: Payer: Medicaid Other

## 2020-07-08 ENCOUNTER — Emergency Department
Admission: EM | Admit: 2020-07-08 | Discharge: 2020-07-08 | Discharge status: Against Medical Advice | Payer: Medicaid Other | Attending: Emergency Medicine | Admitting: Emergency Medicine

## 2020-07-08 DIAGNOSIS — U071 COVID-19: Secondary | ICD-10-CM | POA: Diagnosis not present

## 2020-07-08 DIAGNOSIS — R0602 Shortness of breath: Secondary | ICD-10-CM | POA: Diagnosis not present

## 2020-07-08 DIAGNOSIS — R0781 Pleurodynia: Secondary | ICD-10-CM | POA: Diagnosis not present

## 2020-07-08 DIAGNOSIS — R000 Tachycardia, unspecified: Secondary | ICD-10-CM | POA: Diagnosis not present

## 2020-07-08 DIAGNOSIS — M549 Dorsalgia, unspecified: Secondary | ICD-10-CM | POA: Diagnosis not present

## 2020-07-08 DIAGNOSIS — F17210 Nicotine dependence, cigarettes, uncomplicated: Secondary | ICD-10-CM | POA: Diagnosis not present

## 2020-07-08 DIAGNOSIS — Z720 Tobacco use: Secondary | ICD-10-CM

## 2020-07-08 LAB — CBC
HCT: 43.9 % (ref 36.0–46.0)
Hemoglobin: 14.4 g/dL (ref 12.0–15.0)
MCH: 27.5 pg (ref 26.0–34.0)
MCHC: 32.8 g/dL (ref 30.0–36.0)
MCV: 83.8 fL (ref 80.0–100.0)
Platelets: 196 10*3/uL (ref 150–400)
RBC: 5.24 MIL/uL — ABNORMAL HIGH (ref 3.87–5.11)
RDW: 12.5 % (ref 11.5–15.5)
WBC: 4.9 10*3/uL (ref 4.0–10.5)
nRBC: 0 % (ref 0.0–0.2)

## 2020-07-08 LAB — BASIC METABOLIC PANEL
Anion gap: 11 (ref 5–15)
BUN: 12 mg/dL (ref 6–20)
CO2: 26 mmol/L (ref 22–32)
Calcium: 8.5 mg/dL — ABNORMAL LOW (ref 8.9–10.3)
Chloride: 103 mmol/L (ref 98–111)
Creatinine, Ser: 0.77 mg/dL (ref 0.44–1.00)
GFR, Estimated: >60 mL/min (ref >60)
Glucose, Bld: 126 mg/dL — ABNORMAL HIGH (ref 70–99)
Potassium: 3.5 mmol/L (ref 3.5–5.1)
Sodium: 140 mmol/L (ref 135–145)

## 2020-07-08 LAB — POC URINE PREG, ED: Preg Test, Ur: NEGATIVE

## 2020-07-08 LAB — POC SARS CORONAVIRUS 2 AG -  ED: SARS Coronavirus 2 Ag: POSITIVE — AB

## 2020-07-08 LAB — TROPONIN I (HIGH SENSITIVITY)
Troponin I (High Sensitivity): 3 ng/L (ref <18)
Troponin I (High Sensitivity): 3 ng/L (ref <18)

## 2020-07-08 MED ORDER — ACETAMINOPHEN 500 MG PO TABS
1000.0000 mg | ORAL_TABLET | Freq: Once | ORAL | Status: AC
  Administered 2020-07-08: 1000 mg via ORAL
  Filled 2020-07-08: qty 2

## 2020-07-08 MED ORDER — IBUPROFEN 400 MG PO TABS
400.0000 mg | ORAL_TABLET | Freq: Once | ORAL | Status: AC
  Administered 2020-07-08: 400 mg via ORAL
  Filled 2020-07-08: qty 1
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
## 2020-07-08 NOTE — ED Notes (Signed)
Pregnancy test: Negative

## 2020-07-08 NOTE — ED Triage Notes (Signed)
Pt comes pov with chest pain radiating into back for 2 days. Pt states waking up in a sweat. Denies cough or congestion. Denies cardiac hx.

## 2020-07-08 NOTE — ED Notes (Signed)
Patient refusing to sign AMA form and left.

## 2020-07-28 IMAGING — US VENOUS DOPPLER ULTRASOUND OF  LOWER EXTREMITIES
1 series · 13 of 24 positions shown · non-contrast
Comparison: None.

CLINICAL DATA: Bilateral edema, exclude DVT



[Series 1: venous doppler ultrasound of lower extremities · 13 of 81 slices shown]
[im 1/81]
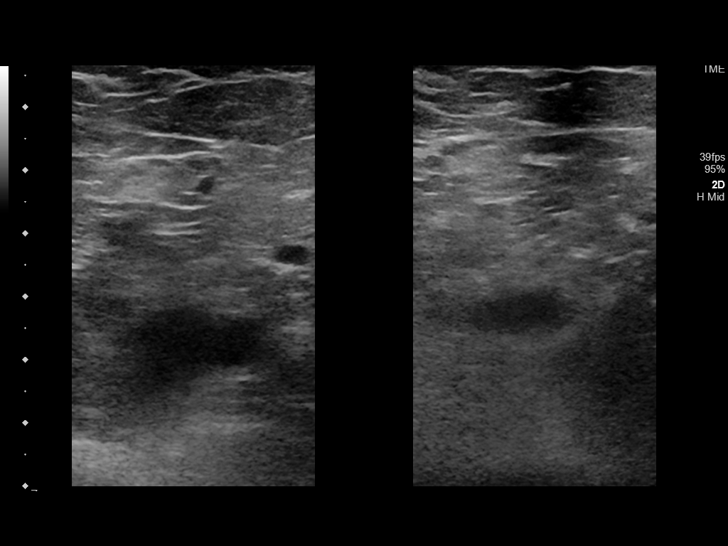
[im 7/81]
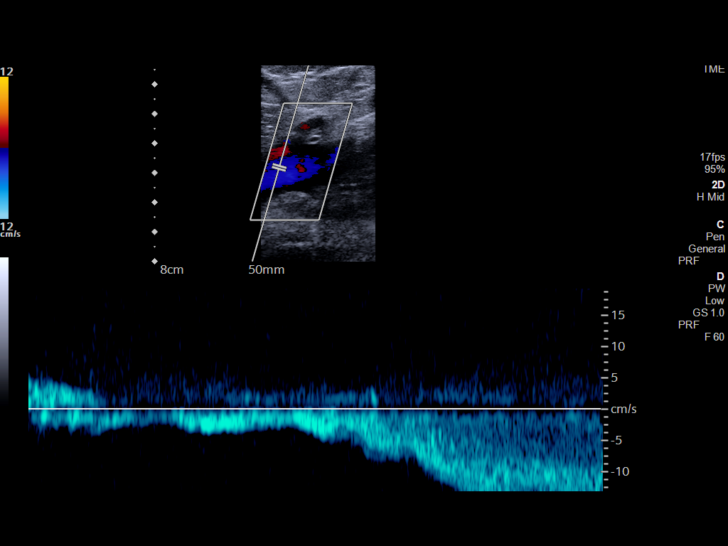
[im 14/81]
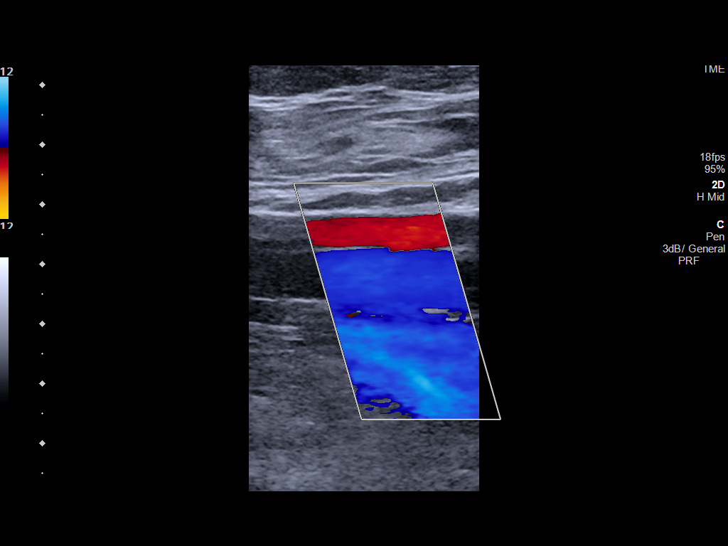
[im 21/81]
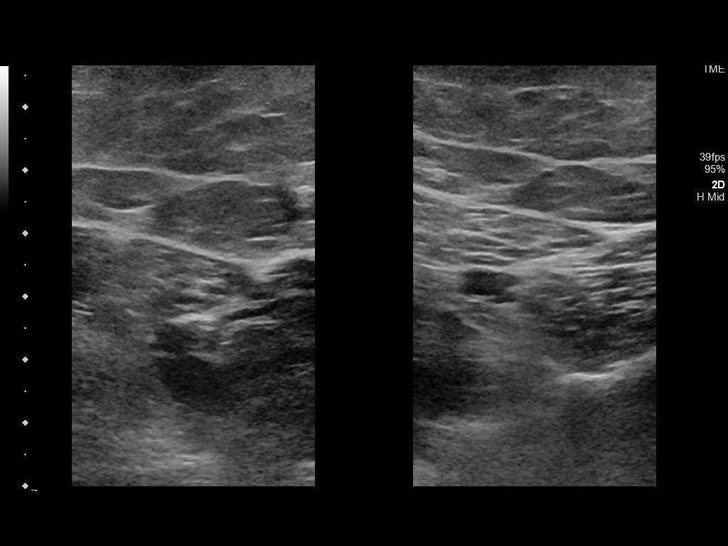
[im 28/81]
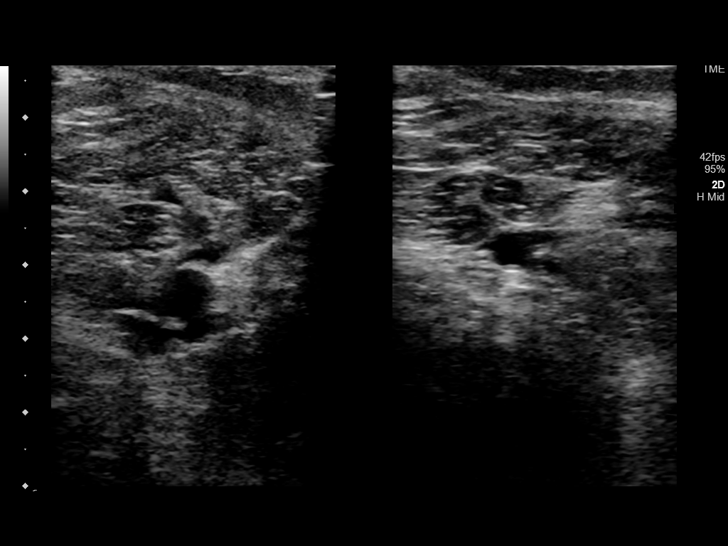
[im 35/81]
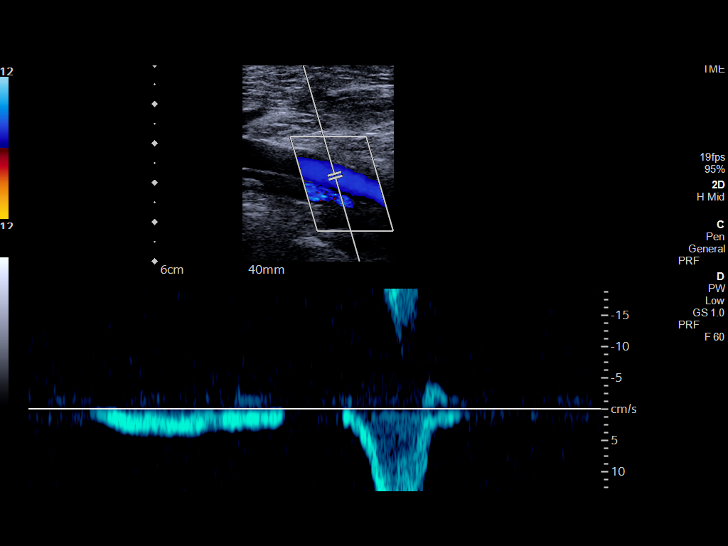
[im 42/81]
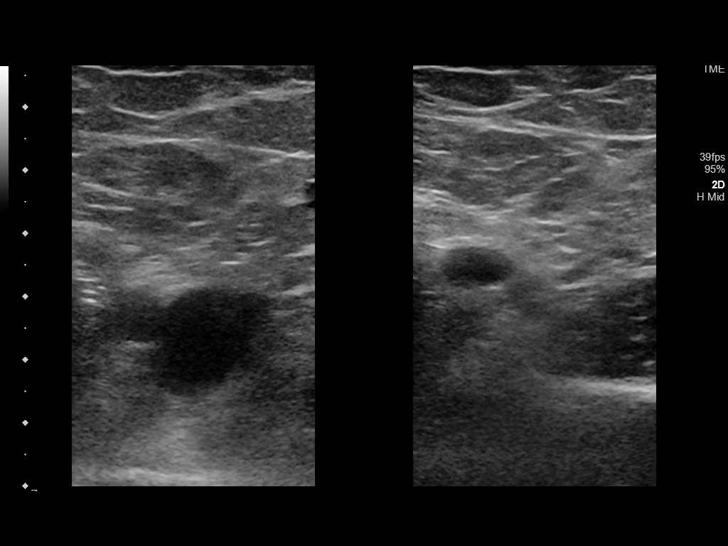
[im 46/81]
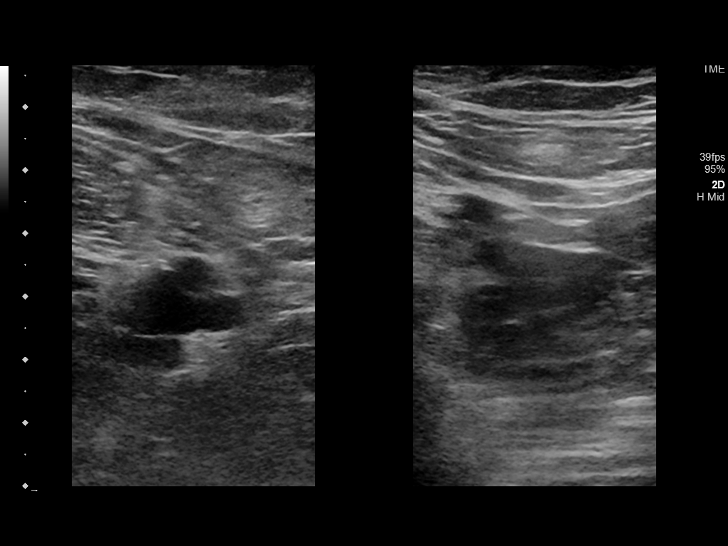
[im 53/81]
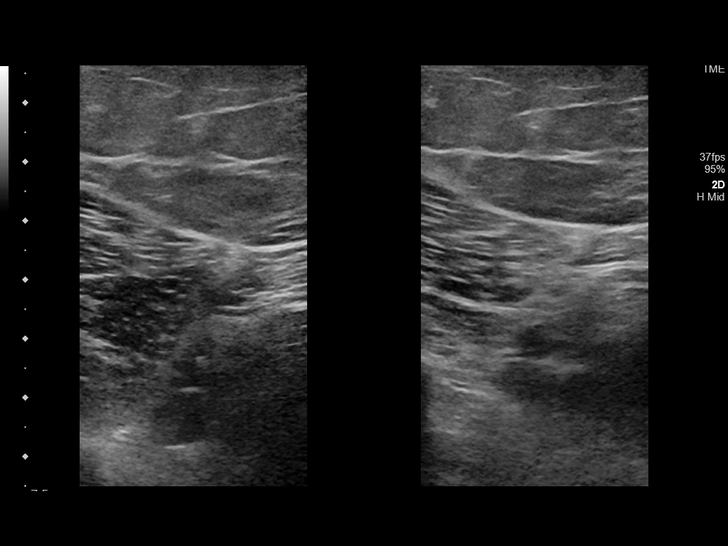
[im 60/81]
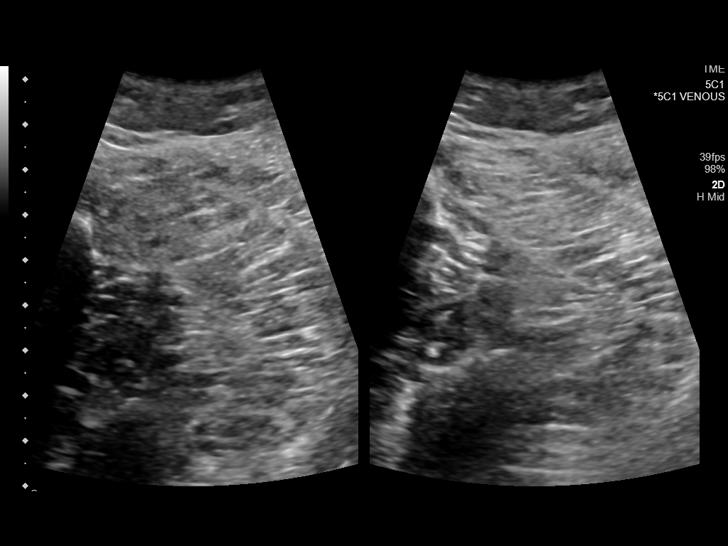
[im 67/81]
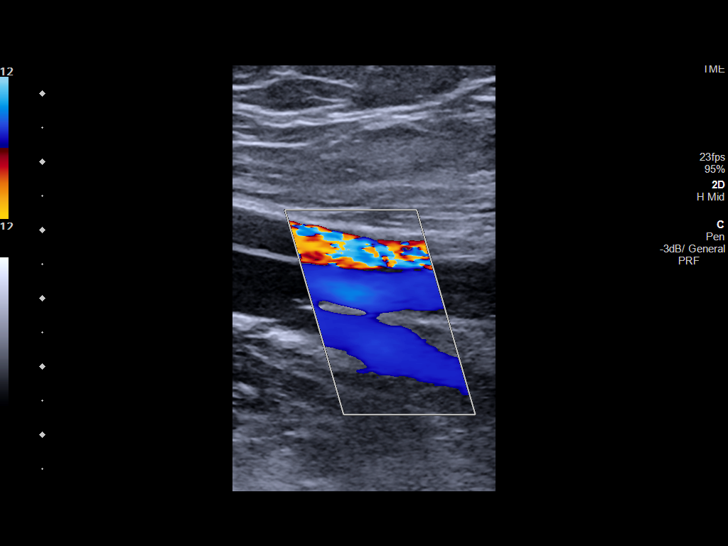
[im 74/81]
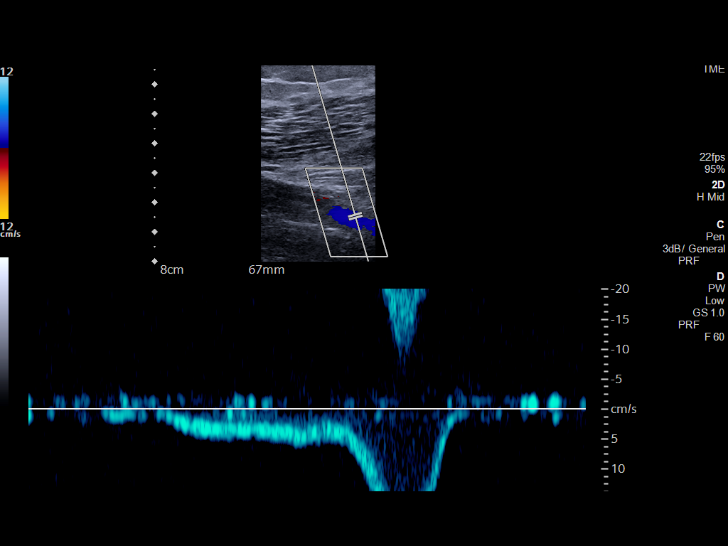
[im 81/81]
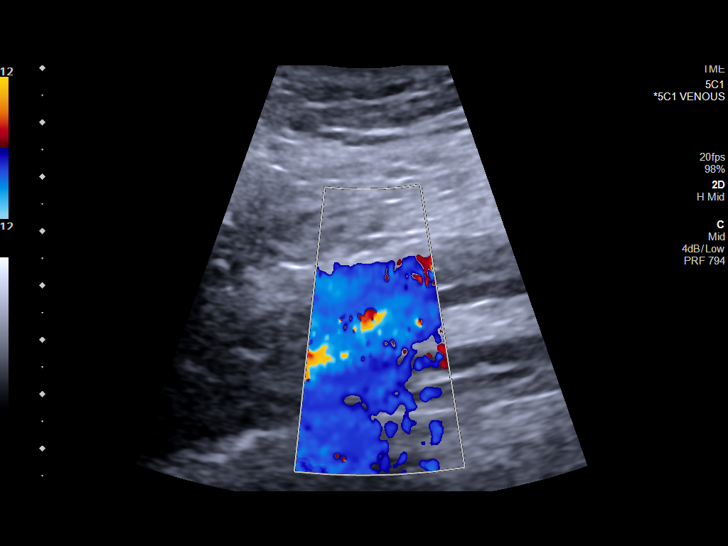

[13 of 24 positions shown; findings below may reference images not displayed]

FINDINGS: RIGHT LOWER EXTREMITY

Common Femoral Vein: No evidence of thrombus. Normal
compressibility, respiratory phasicity and response to augmentation.

Saphenofemoral Junction: No evidence of thrombus. Normal
compressibility and flow on color Doppler imaging.

Profunda Femoral Vein: No evidence of thrombus. Normal
compressibility and flow on color Doppler imaging.

Femoral Vein: No evidence of thrombus. Normal compressibility,
respiratory phasicity and response to augmentation.

Popliteal Vein: No evidence of thrombus. Normal compressibility,
respiratory phasicity and response to augmentation.

Calf Veins: No evidence of thrombus. Normal compressibility and flow
on color Doppler imaging.

Superficial Great Saphenous Vein: No evidence of thrombus. Normal
compressibility.

Venous Reflux:  None.

Other Findings:  None.

LEFT LOWER EXTREMITY

Common Femoral Vein: No evidence of thrombus. Normal
compressibility, respiratory phasicity and response to augmentation.

Saphenofemoral Junction: No evidence of thrombus. Normal
compressibility and flow on color Doppler imaging.

Profunda Femoral Vein: No evidence of thrombus. Normal
compressibility and flow on color Doppler imaging.

Femoral Vein: No evidence of thrombus. Normal compressibility,
respiratory phasicity and response to augmentation.

Popliteal Vein: No evidence of thrombus. Normal compressibility,
respiratory phasicity and response to augmentation.

Calf Veins: No evidence of thrombus. Normal compressibility and flow
on color Doppler imaging.

Superficial Great Saphenous Vein: No evidence of thrombus. Normal
compressibility.

Venous Reflux:  None.

Other Findings:  None.
IMPRESSION: No evidence of deep venous thrombosis in either lower extremity.

## 2020-07-31 DIAGNOSIS — Z1389 Encounter for screening for other disorder: Secondary | ICD-10-CM | POA: Diagnosis not present

## 2020-07-31 DIAGNOSIS — F902 Attention-deficit hyperactivity disorder, combined type: Secondary | ICD-10-CM | POA: Diagnosis not present

## 2020-07-31 DIAGNOSIS — F4312 Post-traumatic stress disorder, chronic: Secondary | ICD-10-CM | POA: Diagnosis not present

## 2020-07-31 DIAGNOSIS — Z79899 Other long term (current) drug therapy: Secondary | ICD-10-CM | POA: Diagnosis not present

## 2020-09-28 DIAGNOSIS — Z79899 Other long term (current) drug therapy: Secondary | ICD-10-CM | POA: Diagnosis not present

## 2020-09-28 DIAGNOSIS — F411 Generalized anxiety disorder: Secondary | ICD-10-CM | POA: Diagnosis not present

## 2020-10-04 DIAGNOSIS — Z1389 Encounter for screening for other disorder: Secondary | ICD-10-CM | POA: Diagnosis not present

## 2020-10-04 DIAGNOSIS — F411 Generalized anxiety disorder: Secondary | ICD-10-CM | POA: Diagnosis not present

## 2020-10-04 DIAGNOSIS — Z79899 Other long term (current) drug therapy: Secondary | ICD-10-CM | POA: Diagnosis not present

## 2020-10-04 DIAGNOSIS — F4312 Post-traumatic stress disorder, chronic: Secondary | ICD-10-CM | POA: Diagnosis not present

## 2020-10-04 DIAGNOSIS — F902 Attention-deficit hyperactivity disorder, combined type: Secondary | ICD-10-CM | POA: Diagnosis not present

## 2020-11-29 DIAGNOSIS — Z79899 Other long term (current) drug therapy: Secondary | ICD-10-CM | POA: Diagnosis not present

## 2020-11-29 DIAGNOSIS — Z1389 Encounter for screening for other disorder: Secondary | ICD-10-CM | POA: Diagnosis not present

## 2020-11-29 DIAGNOSIS — F902 Attention-deficit hyperactivity disorder, combined type: Secondary | ICD-10-CM | POA: Diagnosis not present

## 2020-11-29 DIAGNOSIS — F411 Generalized anxiety disorder: Secondary | ICD-10-CM | POA: Diagnosis not present

## 2020-11-29 DIAGNOSIS — F4312 Post-traumatic stress disorder, chronic: Secondary | ICD-10-CM | POA: Diagnosis not present

## 2021-01-24 DIAGNOSIS — F902 Attention-deficit hyperactivity disorder, combined type: Secondary | ICD-10-CM | POA: Diagnosis not present

## 2021-01-24 DIAGNOSIS — F4312 Post-traumatic stress disorder, chronic: Secondary | ICD-10-CM | POA: Diagnosis not present

## 2021-01-24 DIAGNOSIS — F411 Generalized anxiety disorder: Secondary | ICD-10-CM | POA: Diagnosis not present

## 2021-01-24 DIAGNOSIS — Z79899 Other long term (current) drug therapy: Secondary | ICD-10-CM | POA: Diagnosis not present

## 2021-05-02 DIAGNOSIS — F902 Attention-deficit hyperactivity disorder, combined type: Secondary | ICD-10-CM | POA: Diagnosis not present

## 2021-05-02 DIAGNOSIS — F411 Generalized anxiety disorder: Secondary | ICD-10-CM | POA: Diagnosis not present

## 2021-05-02 DIAGNOSIS — F4312 Post-traumatic stress disorder, chronic: Secondary | ICD-10-CM | POA: Diagnosis not present

## 2021-09-20 DIAGNOSIS — R69 Illness, unspecified: Secondary | ICD-10-CM | POA: Diagnosis not present

## 2021-09-26 DIAGNOSIS — F4310 Post-traumatic stress disorder, unspecified: Secondary | ICD-10-CM | POA: Diagnosis not present

## 2021-10-10 DIAGNOSIS — F4310 Post-traumatic stress disorder, unspecified: Secondary | ICD-10-CM | POA: Diagnosis not present

## 2021-10-10 DIAGNOSIS — F902 Attention-deficit hyperactivity disorder, combined type: Secondary | ICD-10-CM | POA: Diagnosis not present

## 2021-10-17 DIAGNOSIS — F902 Attention-deficit hyperactivity disorder, combined type: Secondary | ICD-10-CM | POA: Diagnosis not present

## 2021-10-18 DIAGNOSIS — F324 Major depressive disorder, single episode, in partial remission: Secondary | ICD-10-CM | POA: Diagnosis not present

## 2021-10-18 DIAGNOSIS — F902 Attention-deficit hyperactivity disorder, combined type: Secondary | ICD-10-CM | POA: Diagnosis not present

## 2021-10-24 DIAGNOSIS — F902 Attention-deficit hyperactivity disorder, combined type: Secondary | ICD-10-CM | POA: Diagnosis not present

## 2021-10-24 DIAGNOSIS — F4310 Post-traumatic stress disorder, unspecified: Secondary | ICD-10-CM | POA: Diagnosis not present

## 2021-11-07 DIAGNOSIS — F902 Attention-deficit hyperactivity disorder, combined type: Secondary | ICD-10-CM | POA: Diagnosis not present

## 2021-11-07 DIAGNOSIS — F324 Major depressive disorder, single episode, in partial remission: Secondary | ICD-10-CM | POA: Diagnosis not present

## 2021-11-07 DIAGNOSIS — F4310 Post-traumatic stress disorder, unspecified: Secondary | ICD-10-CM | POA: Diagnosis not present

## 2021-11-20 DIAGNOSIS — F4310 Post-traumatic stress disorder, unspecified: Secondary | ICD-10-CM | POA: Diagnosis not present

## 2021-11-20 DIAGNOSIS — F324 Major depressive disorder, single episode, in partial remission: Secondary | ICD-10-CM | POA: Diagnosis not present

## 2021-11-20 DIAGNOSIS — F902 Attention-deficit hyperactivity disorder, combined type: Secondary | ICD-10-CM | POA: Diagnosis not present

## 2021-12-05 DIAGNOSIS — F4310 Post-traumatic stress disorder, unspecified: Secondary | ICD-10-CM | POA: Diagnosis not present

## 2021-12-05 DIAGNOSIS — F902 Attention-deficit hyperactivity disorder, combined type: Secondary | ICD-10-CM | POA: Diagnosis not present

## 2021-12-19 DIAGNOSIS — F324 Major depressive disorder, single episode, in partial remission: Secondary | ICD-10-CM | POA: Diagnosis not present

## 2021-12-19 DIAGNOSIS — F902 Attention-deficit hyperactivity disorder, combined type: Secondary | ICD-10-CM | POA: Diagnosis not present

## 2021-12-19 DIAGNOSIS — F4310 Post-traumatic stress disorder, unspecified: Secondary | ICD-10-CM | POA: Diagnosis not present

## 2021-12-19 DIAGNOSIS — Z79899 Other long term (current) drug therapy: Secondary | ICD-10-CM | POA: Diagnosis not present

## 2022-01-16 DIAGNOSIS — Z79899 Other long term (current) drug therapy: Secondary | ICD-10-CM | POA: Diagnosis not present

## 2022-01-16 DIAGNOSIS — R319 Hematuria, unspecified: Secondary | ICD-10-CM | POA: Diagnosis not present

## 2022-01-16 DIAGNOSIS — F324 Major depressive disorder, single episode, in partial remission: Secondary | ICD-10-CM | POA: Diagnosis not present

## 2022-01-16 DIAGNOSIS — F902 Attention-deficit hyperactivity disorder, combined type: Secondary | ICD-10-CM | POA: Diagnosis not present

## 2022-01-16 DIAGNOSIS — F4310 Post-traumatic stress disorder, unspecified: Secondary | ICD-10-CM | POA: Diagnosis not present

## 2022-02-13 DIAGNOSIS — F4310 Post-traumatic stress disorder, unspecified: Secondary | ICD-10-CM | POA: Diagnosis not present

## 2022-02-13 DIAGNOSIS — F900 Attention-deficit hyperactivity disorder, predominantly inattentive type: Secondary | ICD-10-CM | POA: Diagnosis not present

## 2022-02-13 DIAGNOSIS — F324 Major depressive disorder, single episode, in partial remission: Secondary | ICD-10-CM | POA: Diagnosis not present

## 2022-02-27 DIAGNOSIS — F900 Attention-deficit hyperactivity disorder, predominantly inattentive type: Secondary | ICD-10-CM | POA: Diagnosis not present

## 2022-02-27 DIAGNOSIS — F324 Major depressive disorder, single episode, in partial remission: Secondary | ICD-10-CM | POA: Diagnosis not present

## 2022-02-27 DIAGNOSIS — F4310 Post-traumatic stress disorder, unspecified: Secondary | ICD-10-CM | POA: Diagnosis not present

## 2022-03-13 DIAGNOSIS — F4310 Post-traumatic stress disorder, unspecified: Secondary | ICD-10-CM | POA: Diagnosis not present

## 2022-03-13 DIAGNOSIS — F324 Major depressive disorder, single episode, in partial remission: Secondary | ICD-10-CM | POA: Diagnosis not present

## 2022-03-13 DIAGNOSIS — F900 Attention-deficit hyperactivity disorder, predominantly inattentive type: Secondary | ICD-10-CM | POA: Diagnosis not present

## 2022-04-03 DIAGNOSIS — F4310 Post-traumatic stress disorder, unspecified: Secondary | ICD-10-CM | POA: Diagnosis not present

## 2022-04-03 DIAGNOSIS — F900 Attention-deficit hyperactivity disorder, predominantly inattentive type: Secondary | ICD-10-CM | POA: Diagnosis not present

## 2022-04-03 DIAGNOSIS — Z79899 Other long term (current) drug therapy: Secondary | ICD-10-CM | POA: Diagnosis not present

## 2022-04-03 DIAGNOSIS — F324 Major depressive disorder, single episode, in partial remission: Secondary | ICD-10-CM | POA: Diagnosis not present

## 2022-04-17 DIAGNOSIS — F4310 Post-traumatic stress disorder, unspecified: Secondary | ICD-10-CM | POA: Diagnosis not present

## 2022-04-17 DIAGNOSIS — F900 Attention-deficit hyperactivity disorder, predominantly inattentive type: Secondary | ICD-10-CM | POA: Diagnosis not present

## 2022-04-17 DIAGNOSIS — F324 Major depressive disorder, single episode, in partial remission: Secondary | ICD-10-CM | POA: Diagnosis not present

## 2022-05-01 DIAGNOSIS — F324 Major depressive disorder, single episode, in partial remission: Secondary | ICD-10-CM | POA: Diagnosis not present

## 2022-05-01 DIAGNOSIS — F4310 Post-traumatic stress disorder, unspecified: Secondary | ICD-10-CM | POA: Diagnosis not present

## 2022-05-01 DIAGNOSIS — F900 Attention-deficit hyperactivity disorder, predominantly inattentive type: Secondary | ICD-10-CM | POA: Diagnosis not present

## 2022-05-15 DIAGNOSIS — F900 Attention-deficit hyperactivity disorder, predominantly inattentive type: Secondary | ICD-10-CM | POA: Diagnosis not present

## 2022-05-15 DIAGNOSIS — F4310 Post-traumatic stress disorder, unspecified: Secondary | ICD-10-CM | POA: Diagnosis not present

## 2022-05-15 DIAGNOSIS — F324 Major depressive disorder, single episode, in partial remission: Secondary | ICD-10-CM | POA: Diagnosis not present

## 2022-05-29 DIAGNOSIS — E669 Obesity, unspecified: Secondary | ICD-10-CM | POA: Diagnosis not present

## 2022-05-29 DIAGNOSIS — F324 Major depressive disorder, single episode, in partial remission: Secondary | ICD-10-CM | POA: Diagnosis not present

## 2022-05-29 DIAGNOSIS — Z79899 Other long term (current) drug therapy: Secondary | ICD-10-CM | POA: Diagnosis not present

## 2022-05-29 DIAGNOSIS — F4310 Post-traumatic stress disorder, unspecified: Secondary | ICD-10-CM | POA: Diagnosis not present

## 2022-05-29 DIAGNOSIS — F902 Attention-deficit hyperactivity disorder, combined type: Secondary | ICD-10-CM | POA: Diagnosis not present

## 2022-06-13 ENCOUNTER — Ambulatory Visit
Admission: EM | Admit: 2022-06-13 | Discharge: 2022-06-13 | Disposition: A | Discharge status: Home / Self Care | Payer: Medicaid Other | Attending: Internal Medicine | Admitting: Internal Medicine

## 2022-06-13 DIAGNOSIS — J069 Acute upper respiratory infection, unspecified: Secondary | ICD-10-CM | POA: Diagnosis not present

## 2022-06-13 DIAGNOSIS — Z1152 Encounter for screening for COVID-19: Secondary | ICD-10-CM | POA: Insufficient documentation

## 2022-06-13 LAB — RESP PANEL BY RT-PCR (FLU A&B, COVID) ARPGX2
Influenza A by PCR: NEGATIVE
Influenza B by PCR: NEGATIVE
SARS Coronavirus 2 by RT PCR: NEGATIVE

## 2022-06-13 LAB — GROUP A STREP BY PCR: Group A Strep by PCR: NOT DETECTED

## 2022-06-13 MED ORDER — ALBUTEROL SULFATE HFA 108 (90 BASE) MCG/ACT IN AERS: 2.0000 | INHALATION_SPRAY | RESPIRATORY_TRACT | 0 refills | Status: AC | PRN

## 2022-06-13 MED ORDER — BENZONATATE 200 MG PO CAPS: 200.0000 mg | ORAL_CAPSULE | Freq: Three times a day (TID) | ORAL | 0 refills | Status: AC | PRN
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
## 2022-06-13 NOTE — Discharge Instructions (Signed)
I will call you back when the results are back.
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
## 2022-06-26 DIAGNOSIS — F902 Attention-deficit hyperactivity disorder, combined type: Secondary | ICD-10-CM | POA: Diagnosis not present

## 2022-06-26 DIAGNOSIS — F4310 Post-traumatic stress disorder, unspecified: Secondary | ICD-10-CM | POA: Diagnosis not present

## 2022-06-26 DIAGNOSIS — Z79899 Other long term (current) drug therapy: Secondary | ICD-10-CM | POA: Diagnosis not present

## 2022-06-26 DIAGNOSIS — E669 Obesity, unspecified: Secondary | ICD-10-CM | POA: Diagnosis not present

## 2022-06-26 DIAGNOSIS — F324 Major depressive disorder, single episode, in partial remission: Secondary | ICD-10-CM | POA: Diagnosis not present

## 2022-07-10 DIAGNOSIS — F324 Major depressive disorder, single episode, in partial remission: Secondary | ICD-10-CM | POA: Diagnosis not present

## 2022-07-10 DIAGNOSIS — Z79899 Other long term (current) drug therapy: Secondary | ICD-10-CM | POA: Diagnosis not present

## 2022-07-10 DIAGNOSIS — F4310 Post-traumatic stress disorder, unspecified: Secondary | ICD-10-CM | POA: Diagnosis not present

## 2022-07-10 DIAGNOSIS — F902 Attention-deficit hyperactivity disorder, combined type: Secondary | ICD-10-CM | POA: Diagnosis not present

## 2022-07-10 DIAGNOSIS — E669 Obesity, unspecified: Secondary | ICD-10-CM | POA: Diagnosis not present

## 2022-07-24 DIAGNOSIS — Z79899 Other long term (current) drug therapy: Secondary | ICD-10-CM | POA: Diagnosis not present

## 2022-07-24 DIAGNOSIS — F324 Major depressive disorder, single episode, in partial remission: Secondary | ICD-10-CM | POA: Diagnosis not present

## 2022-07-24 DIAGNOSIS — F902 Attention-deficit hyperactivity disorder, combined type: Secondary | ICD-10-CM | POA: Diagnosis not present

## 2022-07-24 DIAGNOSIS — F4310 Post-traumatic stress disorder, unspecified: Secondary | ICD-10-CM | POA: Diagnosis not present

## 2022-07-24 DIAGNOSIS — E669 Obesity, unspecified: Secondary | ICD-10-CM | POA: Diagnosis not present

## 2022-08-07 DIAGNOSIS — Z79899 Other long term (current) drug therapy: Secondary | ICD-10-CM | POA: Diagnosis not present

## 2022-08-07 DIAGNOSIS — E669 Obesity, unspecified: Secondary | ICD-10-CM | POA: Diagnosis not present

## 2022-08-07 DIAGNOSIS — F902 Attention-deficit hyperactivity disorder, combined type: Secondary | ICD-10-CM | POA: Diagnosis not present

## 2022-08-07 DIAGNOSIS — F4310 Post-traumatic stress disorder, unspecified: Secondary | ICD-10-CM | POA: Diagnosis not present

## 2022-08-07 DIAGNOSIS — F17210 Nicotine dependence, cigarettes, uncomplicated: Secondary | ICD-10-CM | POA: Diagnosis not present

## 2022-08-07 DIAGNOSIS — F324 Major depressive disorder, single episode, in partial remission: Secondary | ICD-10-CM | POA: Diagnosis not present

## 2022-08-21 DIAGNOSIS — F902 Attention-deficit hyperactivity disorder, combined type: Secondary | ICD-10-CM | POA: Diagnosis not present

## 2022-08-21 DIAGNOSIS — Z79899 Other long term (current) drug therapy: Secondary | ICD-10-CM | POA: Diagnosis not present

## 2022-08-21 DIAGNOSIS — F17210 Nicotine dependence, cigarettes, uncomplicated: Secondary | ICD-10-CM | POA: Diagnosis not present

## 2022-08-21 DIAGNOSIS — F324 Major depressive disorder, single episode, in partial remission: Secondary | ICD-10-CM | POA: Diagnosis not present

## 2022-08-21 DIAGNOSIS — F4310 Post-traumatic stress disorder, unspecified: Secondary | ICD-10-CM | POA: Diagnosis not present

## 2022-08-21 DIAGNOSIS — E669 Obesity, unspecified: Secondary | ICD-10-CM | POA: Diagnosis not present

## 2022-09-04 DIAGNOSIS — Z79899 Other long term (current) drug therapy: Secondary | ICD-10-CM | POA: Diagnosis not present

## 2022-09-04 DIAGNOSIS — F902 Attention-deficit hyperactivity disorder, combined type: Secondary | ICD-10-CM | POA: Diagnosis not present

## 2022-09-04 DIAGNOSIS — E669 Obesity, unspecified: Secondary | ICD-10-CM | POA: Diagnosis not present

## 2022-09-04 DIAGNOSIS — F4310 Post-traumatic stress disorder, unspecified: Secondary | ICD-10-CM | POA: Diagnosis not present

## 2022-09-04 DIAGNOSIS — F17210 Nicotine dependence, cigarettes, uncomplicated: Secondary | ICD-10-CM | POA: Diagnosis not present

## 2022-09-04 DIAGNOSIS — F324 Major depressive disorder, single episode, in partial remission: Secondary | ICD-10-CM | POA: Diagnosis not present

## 2022-09-19 DIAGNOSIS — F17210 Nicotine dependence, cigarettes, uncomplicated: Secondary | ICD-10-CM | POA: Diagnosis not present

## 2022-09-19 DIAGNOSIS — F902 Attention-deficit hyperactivity disorder, combined type: Secondary | ICD-10-CM | POA: Diagnosis not present

## 2022-09-19 DIAGNOSIS — Z79899 Other long term (current) drug therapy: Secondary | ICD-10-CM | POA: Diagnosis not present

## 2022-09-19 DIAGNOSIS — F324 Major depressive disorder, single episode, in partial remission: Secondary | ICD-10-CM | POA: Diagnosis not present

## 2022-09-19 DIAGNOSIS — F4310 Post-traumatic stress disorder, unspecified: Secondary | ICD-10-CM | POA: Diagnosis not present

## 2022-09-19 DIAGNOSIS — E669 Obesity, unspecified: Secondary | ICD-10-CM | POA: Diagnosis not present

## 2022-09-25 DIAGNOSIS — F902 Attention-deficit hyperactivity disorder, combined type: Secondary | ICD-10-CM | POA: Diagnosis not present

## 2022-09-25 DIAGNOSIS — Z79899 Other long term (current) drug therapy: Secondary | ICD-10-CM | POA: Diagnosis not present

## 2022-09-25 DIAGNOSIS — E669 Obesity, unspecified: Secondary | ICD-10-CM | POA: Diagnosis not present

## 2022-09-25 DIAGNOSIS — F17210 Nicotine dependence, cigarettes, uncomplicated: Secondary | ICD-10-CM | POA: Diagnosis not present

## 2022-09-25 DIAGNOSIS — F4310 Post-traumatic stress disorder, unspecified: Secondary | ICD-10-CM | POA: Diagnosis not present

## 2022-09-25 DIAGNOSIS — F324 Major depressive disorder, single episode, in partial remission: Secondary | ICD-10-CM | POA: Diagnosis not present

## 2022-10-02 DIAGNOSIS — F902 Attention-deficit hyperactivity disorder, combined type: Secondary | ICD-10-CM | POA: Diagnosis not present

## 2022-10-02 DIAGNOSIS — E669 Obesity, unspecified: Secondary | ICD-10-CM | POA: Diagnosis not present

## 2022-10-02 DIAGNOSIS — F4310 Post-traumatic stress disorder, unspecified: Secondary | ICD-10-CM | POA: Diagnosis not present

## 2022-10-02 DIAGNOSIS — F324 Major depressive disorder, single episode, in partial remission: Secondary | ICD-10-CM | POA: Diagnosis not present

## 2022-10-02 DIAGNOSIS — F17210 Nicotine dependence, cigarettes, uncomplicated: Secondary | ICD-10-CM | POA: Diagnosis not present

## 2022-10-02 DIAGNOSIS — Z79899 Other long term (current) drug therapy: Secondary | ICD-10-CM | POA: Diagnosis not present

## 2022-10-09 DIAGNOSIS — Z79899 Other long term (current) drug therapy: Secondary | ICD-10-CM | POA: Diagnosis not present

## 2022-10-09 DIAGNOSIS — F17210 Nicotine dependence, cigarettes, uncomplicated: Secondary | ICD-10-CM | POA: Diagnosis not present

## 2022-10-09 DIAGNOSIS — E669 Obesity, unspecified: Secondary | ICD-10-CM | POA: Diagnosis not present

## 2022-10-09 DIAGNOSIS — F324 Major depressive disorder, single episode, in partial remission: Secondary | ICD-10-CM | POA: Diagnosis not present

## 2022-10-09 DIAGNOSIS — F4310 Post-traumatic stress disorder, unspecified: Secondary | ICD-10-CM | POA: Diagnosis not present

## 2022-10-09 DIAGNOSIS — F902 Attention-deficit hyperactivity disorder, combined type: Secondary | ICD-10-CM | POA: Diagnosis not present

## 2022-10-16 DIAGNOSIS — F902 Attention-deficit hyperactivity disorder, combined type: Secondary | ICD-10-CM | POA: Diagnosis not present

## 2022-10-16 DIAGNOSIS — E669 Obesity, unspecified: Secondary | ICD-10-CM | POA: Diagnosis not present

## 2022-10-16 DIAGNOSIS — F324 Major depressive disorder, single episode, in partial remission: Secondary | ICD-10-CM | POA: Diagnosis not present

## 2022-10-16 DIAGNOSIS — F17210 Nicotine dependence, cigarettes, uncomplicated: Secondary | ICD-10-CM | POA: Diagnosis not present

## 2022-10-16 DIAGNOSIS — Z79899 Other long term (current) drug therapy: Secondary | ICD-10-CM | POA: Diagnosis not present

## 2022-10-16 DIAGNOSIS — F4310 Post-traumatic stress disorder, unspecified: Secondary | ICD-10-CM | POA: Diagnosis not present

## 2022-10-23 DIAGNOSIS — F4310 Post-traumatic stress disorder, unspecified: Secondary | ICD-10-CM | POA: Diagnosis not present

## 2022-10-23 DIAGNOSIS — Z79899 Other long term (current) drug therapy: Secondary | ICD-10-CM | POA: Diagnosis not present

## 2022-10-23 DIAGNOSIS — E669 Obesity, unspecified: Secondary | ICD-10-CM | POA: Diagnosis not present

## 2022-10-23 DIAGNOSIS — F17210 Nicotine dependence, cigarettes, uncomplicated: Secondary | ICD-10-CM | POA: Diagnosis not present

## 2022-10-23 DIAGNOSIS — F324 Major depressive disorder, single episode, in partial remission: Secondary | ICD-10-CM | POA: Diagnosis not present

## 2022-10-23 DIAGNOSIS — F902 Attention-deficit hyperactivity disorder, combined type: Secondary | ICD-10-CM | POA: Diagnosis not present

## 2022-11-06 DIAGNOSIS — F324 Major depressive disorder, single episode, in partial remission: Secondary | ICD-10-CM | POA: Diagnosis not present

## 2022-11-06 DIAGNOSIS — F902 Attention-deficit hyperactivity disorder, combined type: Secondary | ICD-10-CM | POA: Diagnosis not present

## 2022-11-06 DIAGNOSIS — E669 Obesity, unspecified: Secondary | ICD-10-CM | POA: Diagnosis not present

## 2022-11-06 DIAGNOSIS — F17210 Nicotine dependence, cigarettes, uncomplicated: Secondary | ICD-10-CM | POA: Diagnosis not present

## 2022-11-06 DIAGNOSIS — Z79899 Other long term (current) drug therapy: Secondary | ICD-10-CM | POA: Diagnosis not present

## 2022-11-06 DIAGNOSIS — F4310 Post-traumatic stress disorder, unspecified: Secondary | ICD-10-CM | POA: Diagnosis not present

## 2022-11-13 DIAGNOSIS — F4310 Post-traumatic stress disorder, unspecified: Secondary | ICD-10-CM | POA: Diagnosis not present

## 2022-11-13 DIAGNOSIS — E669 Obesity, unspecified: Secondary | ICD-10-CM | POA: Diagnosis not present

## 2022-11-13 DIAGNOSIS — F324 Major depressive disorder, single episode, in partial remission: Secondary | ICD-10-CM | POA: Diagnosis not present

## 2022-11-13 DIAGNOSIS — Z79899 Other long term (current) drug therapy: Secondary | ICD-10-CM | POA: Diagnosis not present

## 2022-11-13 DIAGNOSIS — F902 Attention-deficit hyperactivity disorder, combined type: Secondary | ICD-10-CM | POA: Diagnosis not present

## 2022-11-13 DIAGNOSIS — F17210 Nicotine dependence, cigarettes, uncomplicated: Secondary | ICD-10-CM | POA: Diagnosis not present

## 2022-11-15 DIAGNOSIS — R410 Disorientation, unspecified: Secondary | ICD-10-CM | POA: Diagnosis not present

## 2022-11-15 DIAGNOSIS — R3915 Urgency of urination: Secondary | ICD-10-CM | POA: Diagnosis not present

## 2022-11-15 DIAGNOSIS — N3001 Acute cystitis with hematuria: Secondary | ICD-10-CM | POA: Diagnosis not present

## 2022-11-15 DIAGNOSIS — R1030 Lower abdominal pain, unspecified: Secondary | ICD-10-CM | POA: Diagnosis not present

## 2022-11-20 DIAGNOSIS — F902 Attention-deficit hyperactivity disorder, combined type: Secondary | ICD-10-CM | POA: Diagnosis not present

## 2022-11-20 DIAGNOSIS — E669 Obesity, unspecified: Secondary | ICD-10-CM | POA: Diagnosis not present

## 2022-11-20 DIAGNOSIS — F17210 Nicotine dependence, cigarettes, uncomplicated: Secondary | ICD-10-CM | POA: Diagnosis not present

## 2022-11-20 DIAGNOSIS — Z79899 Other long term (current) drug therapy: Secondary | ICD-10-CM | POA: Diagnosis not present

## 2022-11-20 DIAGNOSIS — F4310 Post-traumatic stress disorder, unspecified: Secondary | ICD-10-CM | POA: Diagnosis not present

## 2022-11-20 DIAGNOSIS — F324 Major depressive disorder, single episode, in partial remission: Secondary | ICD-10-CM | POA: Diagnosis not present

## 2022-11-27 DIAGNOSIS — F4310 Post-traumatic stress disorder, unspecified: Secondary | ICD-10-CM | POA: Diagnosis not present

## 2022-11-27 DIAGNOSIS — E669 Obesity, unspecified: Secondary | ICD-10-CM | POA: Diagnosis not present

## 2022-11-27 DIAGNOSIS — F902 Attention-deficit hyperactivity disorder, combined type: Secondary | ICD-10-CM | POA: Diagnosis not present

## 2022-11-27 DIAGNOSIS — F324 Major depressive disorder, single episode, in partial remission: Secondary | ICD-10-CM | POA: Diagnosis not present

## 2022-11-27 DIAGNOSIS — F17210 Nicotine dependence, cigarettes, uncomplicated: Secondary | ICD-10-CM | POA: Diagnosis not present

## 2022-11-27 DIAGNOSIS — Z79899 Other long term (current) drug therapy: Secondary | ICD-10-CM | POA: Diagnosis not present

## 2022-12-04 DIAGNOSIS — F324 Major depressive disorder, single episode, in partial remission: Secondary | ICD-10-CM | POA: Diagnosis not present

## 2022-12-04 DIAGNOSIS — F902 Attention-deficit hyperactivity disorder, combined type: Secondary | ICD-10-CM | POA: Diagnosis not present

## 2022-12-04 DIAGNOSIS — F17210 Nicotine dependence, cigarettes, uncomplicated: Secondary | ICD-10-CM | POA: Diagnosis not present

## 2022-12-04 DIAGNOSIS — F4310 Post-traumatic stress disorder, unspecified: Secondary | ICD-10-CM | POA: Diagnosis not present

## 2022-12-04 DIAGNOSIS — Z79899 Other long term (current) drug therapy: Secondary | ICD-10-CM | POA: Diagnosis not present

## 2022-12-04 DIAGNOSIS — E669 Obesity, unspecified: Secondary | ICD-10-CM | POA: Diagnosis not present

## 2022-12-11 DIAGNOSIS — F324 Major depressive disorder, single episode, in partial remission: Secondary | ICD-10-CM | POA: Diagnosis not present

## 2022-12-11 DIAGNOSIS — E669 Obesity, unspecified: Secondary | ICD-10-CM | POA: Diagnosis not present

## 2022-12-11 DIAGNOSIS — Z0389 Encounter for observation for other suspected diseases and conditions ruled out: Secondary | ICD-10-CM | POA: Diagnosis not present

## 2022-12-11 DIAGNOSIS — F902 Attention-deficit hyperactivity disorder, combined type: Secondary | ICD-10-CM | POA: Diagnosis not present

## 2022-12-11 DIAGNOSIS — Z79899 Other long term (current) drug therapy: Secondary | ICD-10-CM | POA: Diagnosis not present

## 2022-12-11 DIAGNOSIS — F4310 Post-traumatic stress disorder, unspecified: Secondary | ICD-10-CM | POA: Diagnosis not present

## 2022-12-11 DIAGNOSIS — F17210 Nicotine dependence, cigarettes, uncomplicated: Secondary | ICD-10-CM | POA: Diagnosis not present

## 2022-12-18 DIAGNOSIS — Z79899 Other long term (current) drug therapy: Secondary | ICD-10-CM | POA: Diagnosis not present

## 2022-12-18 DIAGNOSIS — F324 Major depressive disorder, single episode, in partial remission: Secondary | ICD-10-CM | POA: Diagnosis not present

## 2022-12-18 DIAGNOSIS — F902 Attention-deficit hyperactivity disorder, combined type: Secondary | ICD-10-CM | POA: Diagnosis not present

## 2022-12-18 DIAGNOSIS — F4381 Prolonged grief disorder: Secondary | ICD-10-CM | POA: Diagnosis not present

## 2023-01-01 DIAGNOSIS — E669 Obesity, unspecified: Secondary | ICD-10-CM | POA: Diagnosis not present

## 2023-01-01 DIAGNOSIS — F4310 Post-traumatic stress disorder, unspecified: Secondary | ICD-10-CM | POA: Diagnosis not present

## 2023-01-01 DIAGNOSIS — F902 Attention-deficit hyperactivity disorder, combined type: Secondary | ICD-10-CM | POA: Diagnosis not present

## 2023-01-01 DIAGNOSIS — F17210 Nicotine dependence, cigarettes, uncomplicated: Secondary | ICD-10-CM | POA: Diagnosis not present

## 2023-01-01 DIAGNOSIS — F4381 Prolonged grief disorder: Secondary | ICD-10-CM | POA: Diagnosis not present

## 2023-01-01 DIAGNOSIS — Z79899 Other long term (current) drug therapy: Secondary | ICD-10-CM | POA: Diagnosis not present

## 2023-01-01 DIAGNOSIS — F324 Major depressive disorder, single episode, in partial remission: Secondary | ICD-10-CM | POA: Diagnosis not present

## 2023-01-08 DIAGNOSIS — F17210 Nicotine dependence, cigarettes, uncomplicated: Secondary | ICD-10-CM | POA: Diagnosis not present

## 2023-01-08 DIAGNOSIS — Z79899 Other long term (current) drug therapy: Secondary | ICD-10-CM | POA: Diagnosis not present

## 2023-01-08 DIAGNOSIS — E669 Obesity, unspecified: Secondary | ICD-10-CM | POA: Diagnosis not present

## 2023-01-08 DIAGNOSIS — F4310 Post-traumatic stress disorder, unspecified: Secondary | ICD-10-CM | POA: Diagnosis not present

## 2023-01-08 DIAGNOSIS — F902 Attention-deficit hyperactivity disorder, combined type: Secondary | ICD-10-CM | POA: Diagnosis not present

## 2023-01-08 DIAGNOSIS — F324 Major depressive disorder, single episode, in partial remission: Secondary | ICD-10-CM | POA: Diagnosis not present

## 2023-01-15 DIAGNOSIS — F4310 Post-traumatic stress disorder, unspecified: Secondary | ICD-10-CM | POA: Diagnosis not present

## 2023-01-15 DIAGNOSIS — F17210 Nicotine dependence, cigarettes, uncomplicated: Secondary | ICD-10-CM | POA: Diagnosis not present

## 2023-01-15 DIAGNOSIS — E669 Obesity, unspecified: Secondary | ICD-10-CM | POA: Diagnosis not present

## 2023-01-15 DIAGNOSIS — F902 Attention-deficit hyperactivity disorder, combined type: Secondary | ICD-10-CM | POA: Diagnosis not present

## 2023-01-15 DIAGNOSIS — Z79899 Other long term (current) drug therapy: Secondary | ICD-10-CM | POA: Diagnosis not present

## 2023-01-15 DIAGNOSIS — F324 Major depressive disorder, single episode, in partial remission: Secondary | ICD-10-CM | POA: Diagnosis not present

## 2023-01-22 DIAGNOSIS — F324 Major depressive disorder, single episode, in partial remission: Secondary | ICD-10-CM | POA: Diagnosis not present

## 2023-01-22 DIAGNOSIS — F4310 Post-traumatic stress disorder, unspecified: Secondary | ICD-10-CM | POA: Diagnosis not present

## 2023-01-22 DIAGNOSIS — F902 Attention-deficit hyperactivity disorder, combined type: Secondary | ICD-10-CM | POA: Diagnosis not present

## 2023-01-22 DIAGNOSIS — Z79899 Other long term (current) drug therapy: Secondary | ICD-10-CM | POA: Diagnosis not present

## 2023-01-22 DIAGNOSIS — E669 Obesity, unspecified: Secondary | ICD-10-CM | POA: Diagnosis not present

## 2023-01-22 DIAGNOSIS — F17210 Nicotine dependence, cigarettes, uncomplicated: Secondary | ICD-10-CM | POA: Diagnosis not present

## 2023-01-29 DIAGNOSIS — E669 Obesity, unspecified: Secondary | ICD-10-CM | POA: Diagnosis not present

## 2023-01-29 DIAGNOSIS — Z79899 Other long term (current) drug therapy: Secondary | ICD-10-CM | POA: Diagnosis not present

## 2023-01-29 DIAGNOSIS — F900 Attention-deficit hyperactivity disorder, predominantly inattentive type: Secondary | ICD-10-CM | POA: Diagnosis not present

## 2023-01-29 DIAGNOSIS — F4310 Post-traumatic stress disorder, unspecified: Secondary | ICD-10-CM | POA: Diagnosis not present

## 2023-01-29 DIAGNOSIS — F324 Major depressive disorder, single episode, in partial remission: Secondary | ICD-10-CM | POA: Diagnosis not present

## 2023-01-29 DIAGNOSIS — F902 Attention-deficit hyperactivity disorder, combined type: Secondary | ICD-10-CM | POA: Diagnosis not present

## 2023-01-29 DIAGNOSIS — F17210 Nicotine dependence, cigarettes, uncomplicated: Secondary | ICD-10-CM | POA: Diagnosis not present

## 2023-03-05 DIAGNOSIS — F17210 Nicotine dependence, cigarettes, uncomplicated: Secondary | ICD-10-CM | POA: Diagnosis not present

## 2023-03-05 DIAGNOSIS — F900 Attention-deficit hyperactivity disorder, predominantly inattentive type: Secondary | ICD-10-CM | POA: Diagnosis not present

## 2023-03-05 DIAGNOSIS — F324 Major depressive disorder, single episode, in partial remission: Secondary | ICD-10-CM | POA: Diagnosis not present

## 2023-03-05 DIAGNOSIS — Z79899 Other long term (current) drug therapy: Secondary | ICD-10-CM | POA: Diagnosis not present

## 2023-03-05 DIAGNOSIS — E669 Obesity, unspecified: Secondary | ICD-10-CM | POA: Diagnosis not present

## 2023-03-05 DIAGNOSIS — F4310 Post-traumatic stress disorder, unspecified: Secondary | ICD-10-CM | POA: Diagnosis not present

## 2023-03-05 DIAGNOSIS — F902 Attention-deficit hyperactivity disorder, combined type: Secondary | ICD-10-CM | POA: Diagnosis not present

## 2023-03-19 DIAGNOSIS — F902 Attention-deficit hyperactivity disorder, combined type: Secondary | ICD-10-CM | POA: Diagnosis not present

## 2023-03-19 DIAGNOSIS — F4310 Post-traumatic stress disorder, unspecified: Secondary | ICD-10-CM | POA: Diagnosis not present

## 2023-03-19 DIAGNOSIS — Z79899 Other long term (current) drug therapy: Secondary | ICD-10-CM | POA: Diagnosis not present

## 2023-03-19 DIAGNOSIS — F17210 Nicotine dependence, cigarettes, uncomplicated: Secondary | ICD-10-CM | POA: Diagnosis not present

## 2023-03-19 DIAGNOSIS — F900 Attention-deficit hyperactivity disorder, predominantly inattentive type: Secondary | ICD-10-CM | POA: Diagnosis not present

## 2023-03-19 DIAGNOSIS — F324 Major depressive disorder, single episode, in partial remission: Secondary | ICD-10-CM | POA: Diagnosis not present

## 2023-03-19 DIAGNOSIS — E669 Obesity, unspecified: Secondary | ICD-10-CM | POA: Diagnosis not present

## 2023-04-02 DIAGNOSIS — F900 Attention-deficit hyperactivity disorder, predominantly inattentive type: Secondary | ICD-10-CM | POA: Diagnosis not present

## 2023-04-02 DIAGNOSIS — Z79899 Other long term (current) drug therapy: Secondary | ICD-10-CM | POA: Diagnosis not present

## 2023-04-02 DIAGNOSIS — F4310 Post-traumatic stress disorder, unspecified: Secondary | ICD-10-CM | POA: Diagnosis not present

## 2023-04-02 DIAGNOSIS — F902 Attention-deficit hyperactivity disorder, combined type: Secondary | ICD-10-CM | POA: Diagnosis not present

## 2023-04-02 DIAGNOSIS — F324 Major depressive disorder, single episode, in partial remission: Secondary | ICD-10-CM | POA: Diagnosis not present

## 2023-04-02 DIAGNOSIS — E669 Obesity, unspecified: Secondary | ICD-10-CM | POA: Diagnosis not present

## 2023-04-02 DIAGNOSIS — F17210 Nicotine dependence, cigarettes, uncomplicated: Secondary | ICD-10-CM | POA: Diagnosis not present

## 2023-04-16 DIAGNOSIS — F17210 Nicotine dependence, cigarettes, uncomplicated: Secondary | ICD-10-CM | POA: Diagnosis not present

## 2023-04-16 DIAGNOSIS — E669 Obesity, unspecified: Secondary | ICD-10-CM | POA: Diagnosis not present

## 2023-04-16 DIAGNOSIS — Z79899 Other long term (current) drug therapy: Secondary | ICD-10-CM | POA: Diagnosis not present

## 2023-04-16 DIAGNOSIS — F4310 Post-traumatic stress disorder, unspecified: Secondary | ICD-10-CM | POA: Diagnosis not present

## 2023-04-16 DIAGNOSIS — F324 Major depressive disorder, single episode, in partial remission: Secondary | ICD-10-CM | POA: Diagnosis not present

## 2023-04-16 DIAGNOSIS — F902 Attention-deficit hyperactivity disorder, combined type: Secondary | ICD-10-CM | POA: Diagnosis not present

## 2023-05-07 DIAGNOSIS — F411 Generalized anxiety disorder: Secondary | ICD-10-CM | POA: Diagnosis not present

## 2023-05-07 DIAGNOSIS — F3181 Bipolar II disorder: Secondary | ICD-10-CM | POA: Diagnosis not present

## 2023-05-07 DIAGNOSIS — F900 Attention-deficit hyperactivity disorder, predominantly inattentive type: Secondary | ICD-10-CM | POA: Diagnosis not present

## 2023-05-14 DIAGNOSIS — F3181 Bipolar II disorder: Secondary | ICD-10-CM | POA: Diagnosis not present

## 2023-05-14 DIAGNOSIS — F411 Generalized anxiety disorder: Secondary | ICD-10-CM | POA: Diagnosis not present

## 2023-05-14 DIAGNOSIS — F900 Attention-deficit hyperactivity disorder, predominantly inattentive type: Secondary | ICD-10-CM | POA: Diagnosis not present

## 2023-06-09 ENCOUNTER — Ambulatory Visit
Admission: EM | Admit: 2023-06-09 | Discharge: 2023-06-09 | Disposition: A | Discharge status: Home / Self Care | Payer: Medicaid Other

## 2023-06-09 DIAGNOSIS — J069 Acute upper respiratory infection, unspecified: Secondary | ICD-10-CM | POA: Diagnosis not present

## 2023-06-09 DIAGNOSIS — J029 Acute pharyngitis, unspecified: Secondary | ICD-10-CM | POA: Diagnosis not present

## 2023-06-09 DIAGNOSIS — R051 Acute cough: Secondary | ICD-10-CM | POA: Diagnosis not present

## 2023-06-09 DIAGNOSIS — F900 Attention-deficit hyperactivity disorder, predominantly inattentive type: Secondary | ICD-10-CM | POA: Diagnosis not present

## 2023-06-09 DIAGNOSIS — F411 Generalized anxiety disorder: Secondary | ICD-10-CM | POA: Diagnosis not present

## 2023-06-09 DIAGNOSIS — F3181 Bipolar II disorder: Secondary | ICD-10-CM | POA: Diagnosis not present

## 2023-06-09 MED ORDER — PROMETHAZINE-DM 6.25-15 MG/5ML PO SYRP: 5.0000 mL | ORAL_SOLUTION | Freq: Four times a day (QID) | ORAL | 0 refills | Status: AC | PRN
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
## 2023-06-09 NOTE — ED Triage Notes (Signed)
Sx x 5 days. Cough chest congestion.

## 2023-06-09 NOTE — Discharge Instructions (Signed)
URI/COLD SYMPTOMS: Your exam today is consistent with a viral illness. Antibiotics are not indicated at this time. Use medications as directed, including cough syrup, nasal saline, and decongestants. Your symptoms should improve over the next few days and resolve within 7-10 days. Increase rest and fluids. F/u if symptoms worsen or predominate such as sore throat, ear pain, productive cough, shortness of breath, or if you develop high fevers or worsening fatigue over the next several days.    If you have return of fever, worsening cough or increased breathing trouble return for re-evaluation and consideratio of a chest xray.

## 2023-07-16 DIAGNOSIS — F900 Attention-deficit hyperactivity disorder, predominantly inattentive type: Secondary | ICD-10-CM | POA: Diagnosis not present

## 2023-07-16 DIAGNOSIS — F3181 Bipolar II disorder: Secondary | ICD-10-CM | POA: Diagnosis not present

## 2023-07-16 DIAGNOSIS — F411 Generalized anxiety disorder: Secondary | ICD-10-CM | POA: Diagnosis not present

## 2023-08-13 DIAGNOSIS — F900 Attention-deficit hyperactivity disorder, predominantly inattentive type: Secondary | ICD-10-CM | POA: Diagnosis not present

## 2023-08-13 DIAGNOSIS — F3181 Bipolar II disorder: Secondary | ICD-10-CM | POA: Diagnosis not present

## 2023-08-13 DIAGNOSIS — F411 Generalized anxiety disorder: Secondary | ICD-10-CM | POA: Diagnosis not present

## 2023-12-05 DIAGNOSIS — R070 Pain in throat: Secondary | ICD-10-CM | POA: Diagnosis not present

## 2023-12-05 DIAGNOSIS — J0301 Acute recurrent streptococcal tonsillitis: Secondary | ICD-10-CM | POA: Diagnosis not present

## 2023-12-19 DIAGNOSIS — F902 Attention-deficit hyperactivity disorder, combined type: Secondary | ICD-10-CM | POA: Diagnosis not present

## 2023-12-19 DIAGNOSIS — F411 Generalized anxiety disorder: Secondary | ICD-10-CM | POA: Diagnosis not present
